# Patient Record
Sex: Male | Born: 1985 | Race: Black or African American | Hispanic: No | Marital: Single | Smoking: Never smoker
Health system: Southern US, Community
[De-identification: ages and names within clinical notes are randomized; demographics above are authoritative.]

## PROBLEM LIST (undated history)

## (undated) DIAGNOSIS — R45851 Suicidal ideations: Secondary | ICD-10-CM

## (undated) DIAGNOSIS — F319 Bipolar disorder, unspecified: Secondary | ICD-10-CM

## (undated) DIAGNOSIS — F259 Schizoaffective disorder, unspecified: Secondary | ICD-10-CM

## (undated) DIAGNOSIS — E7800 Pure hypercholesterolemia, unspecified: Secondary | ICD-10-CM

## (undated) HISTORY — PX: GANGLION CYST EXCISION: SHX1691

## (undated) HISTORY — PX: PILONIDAL CYST EXCISION: SHX744

## (undated) HISTORY — DX: Suicidal ideations: R45.851

---

## 2008-06-28 ENCOUNTER — Emergency Department (HOSPITAL_BASED_OUTPATIENT_CLINIC_OR_DEPARTMENT_OTHER): Admission: EM | Admit: 2008-06-28 | Discharge: 2008-06-29 | Payer: Self-pay | Admitting: Emergency Medicine

## 2008-07-01 ENCOUNTER — Emergency Department (HOSPITAL_BASED_OUTPATIENT_CLINIC_OR_DEPARTMENT_OTHER): Admission: EM | Admit: 2008-07-01 | Discharge: 2008-07-01 | Payer: Self-pay | Admitting: Emergency Medicine

## 2008-09-07 ENCOUNTER — Emergency Department (HOSPITAL_BASED_OUTPATIENT_CLINIC_OR_DEPARTMENT_OTHER): Admission: EM | Admit: 2008-09-07 | Discharge: 2008-09-07 | Payer: Self-pay | Admitting: Emergency Medicine

## 2008-10-11 ENCOUNTER — Other Ambulatory Visit: Payer: Self-pay | Admitting: Emergency Medicine

## 2008-10-12 ENCOUNTER — Inpatient Hospital Stay (HOSPITAL_COMMUNITY): Admission: EM | Admit: 2008-10-12 | Discharge: 2008-10-18 | Payer: Self-pay | Admitting: Internal Medicine

## 2008-10-13 ENCOUNTER — Ambulatory Visit: Payer: Self-pay | Admitting: *Deleted

## 2008-10-25 ENCOUNTER — Other Ambulatory Visit: Payer: Self-pay | Admitting: Emergency Medicine

## 2008-10-26 ENCOUNTER — Inpatient Hospital Stay (HOSPITAL_COMMUNITY): Admission: EM | Admit: 2008-10-26 | Discharge: 2008-10-31 | Payer: Self-pay | Admitting: Internal Medicine

## 2008-12-13 ENCOUNTER — Emergency Department (HOSPITAL_BASED_OUTPATIENT_CLINIC_OR_DEPARTMENT_OTHER): Admission: EM | Admit: 2008-12-13 | Discharge: 2008-12-13 | Payer: Self-pay | Admitting: Emergency Medicine

## 2008-12-24 ENCOUNTER — Emergency Department (HOSPITAL_BASED_OUTPATIENT_CLINIC_OR_DEPARTMENT_OTHER): Admission: EM | Admit: 2008-12-24 | Discharge: 2008-12-24 | Payer: Self-pay | Admitting: Emergency Medicine

## 2008-12-29 ENCOUNTER — Other Ambulatory Visit: Payer: Self-pay | Admitting: Emergency Medicine

## 2008-12-30 ENCOUNTER — Ambulatory Visit: Payer: Self-pay | Admitting: Psychiatry

## 2008-12-30 ENCOUNTER — Other Ambulatory Visit: Payer: Self-pay | Admitting: Emergency Medicine

## 2008-12-30 ENCOUNTER — Inpatient Hospital Stay (HOSPITAL_COMMUNITY): Admission: EM | Admit: 2008-12-30 | Discharge: 2009-01-02 | Payer: Self-pay | Admitting: Psychiatry

## 2009-01-05 ENCOUNTER — Emergency Department (HOSPITAL_BASED_OUTPATIENT_CLINIC_OR_DEPARTMENT_OTHER): Admission: EM | Admit: 2009-01-05 | Discharge: 2009-01-05 | Payer: Self-pay | Admitting: Emergency Medicine

## 2009-01-10 ENCOUNTER — Ambulatory Visit: Payer: Self-pay | Admitting: Diagnostic Radiology

## 2009-01-10 ENCOUNTER — Emergency Department (HOSPITAL_BASED_OUTPATIENT_CLINIC_OR_DEPARTMENT_OTHER): Admission: EM | Admit: 2009-01-10 | Discharge: 2009-01-10 | Payer: Self-pay | Admitting: Emergency Medicine

## 2009-01-14 ENCOUNTER — Emergency Department (HOSPITAL_BASED_OUTPATIENT_CLINIC_OR_DEPARTMENT_OTHER): Admission: EM | Admit: 2009-01-14 | Discharge: 2009-01-15 | Payer: Self-pay | Admitting: Emergency Medicine

## 2009-01-24 ENCOUNTER — Inpatient Hospital Stay (HOSPITAL_COMMUNITY): Admission: EM | Admit: 2009-01-24 | Discharge: 2009-01-26 | Payer: Self-pay | Admitting: Psychiatry

## 2009-01-24 ENCOUNTER — Other Ambulatory Visit: Payer: Self-pay | Admitting: Emergency Medicine

## 2009-01-29 ENCOUNTER — Ambulatory Visit: Payer: Self-pay | Admitting: Psychiatry

## 2009-01-29 ENCOUNTER — Other Ambulatory Visit: Payer: Self-pay | Admitting: Emergency Medicine

## 2009-01-29 ENCOUNTER — Inpatient Hospital Stay (HOSPITAL_COMMUNITY): Admission: RE | Admit: 2009-01-29 | Discharge: 2009-02-04 | Payer: Self-pay | Admitting: Psychiatry

## 2009-02-04 ENCOUNTER — Emergency Department (HOSPITAL_BASED_OUTPATIENT_CLINIC_OR_DEPARTMENT_OTHER): Admission: EM | Admit: 2009-02-04 | Discharge: 2009-02-04 | Payer: Self-pay | Admitting: Emergency Medicine

## 2009-03-10 ENCOUNTER — Emergency Department (HOSPITAL_BASED_OUTPATIENT_CLINIC_OR_DEPARTMENT_OTHER): Admission: EM | Admit: 2009-03-10 | Discharge: 2009-03-10 | Payer: Self-pay | Admitting: Emergency Medicine

## 2009-06-02 ENCOUNTER — Ambulatory Visit: Payer: Self-pay | Admitting: Diagnostic Radiology

## 2009-06-02 ENCOUNTER — Emergency Department (HOSPITAL_BASED_OUTPATIENT_CLINIC_OR_DEPARTMENT_OTHER): Admission: EM | Admit: 2009-06-02 | Discharge: 2009-06-02 | Payer: Self-pay | Admitting: Emergency Medicine

## 2009-06-07 ENCOUNTER — Emergency Department (HOSPITAL_BASED_OUTPATIENT_CLINIC_OR_DEPARTMENT_OTHER): Admission: EM | Admit: 2009-06-07 | Discharge: 2009-06-07 | Payer: Self-pay | Admitting: Emergency Medicine

## 2009-06-28 ENCOUNTER — Emergency Department (HOSPITAL_BASED_OUTPATIENT_CLINIC_OR_DEPARTMENT_OTHER): Admission: EM | Admit: 2009-06-28 | Discharge: 2009-06-29 | Payer: Self-pay | Admitting: Emergency Medicine

## 2009-07-25 ENCOUNTER — Emergency Department (HOSPITAL_BASED_OUTPATIENT_CLINIC_OR_DEPARTMENT_OTHER): Admission: EM | Admit: 2009-07-25 | Discharge: 2009-07-25 | Payer: Self-pay | Admitting: Emergency Medicine

## 2009-09-12 ENCOUNTER — Emergency Department (HOSPITAL_BASED_OUTPATIENT_CLINIC_OR_DEPARTMENT_OTHER): Admission: EM | Admit: 2009-09-12 | Discharge: 2009-09-13 | Payer: Self-pay | Admitting: Emergency Medicine

## 2009-09-13 ENCOUNTER — Ambulatory Visit: Payer: Self-pay | Admitting: Diagnostic Radiology

## 2009-09-23 ENCOUNTER — Emergency Department (HOSPITAL_BASED_OUTPATIENT_CLINIC_OR_DEPARTMENT_OTHER): Admission: EM | Admit: 2009-09-23 | Discharge: 2009-09-23 | Payer: Self-pay | Admitting: Emergency Medicine

## 2009-10-05 ENCOUNTER — Ambulatory Visit: Payer: Self-pay | Admitting: Interventional Radiology

## 2009-10-05 ENCOUNTER — Emergency Department (HOSPITAL_BASED_OUTPATIENT_CLINIC_OR_DEPARTMENT_OTHER): Admission: EM | Admit: 2009-10-05 | Discharge: 2009-10-05 | Payer: Self-pay | Admitting: Emergency Medicine

## 2009-10-14 ENCOUNTER — Emergency Department (HOSPITAL_BASED_OUTPATIENT_CLINIC_OR_DEPARTMENT_OTHER): Admission: EM | Admit: 2009-10-14 | Discharge: 2009-10-15 | Payer: Self-pay | Admitting: Emergency Medicine

## 2009-11-11 ENCOUNTER — Emergency Department (HOSPITAL_BASED_OUTPATIENT_CLINIC_OR_DEPARTMENT_OTHER): Admission: EM | Admit: 2009-11-11 | Discharge: 2009-11-11 | Payer: Self-pay | Admitting: Emergency Medicine

## 2010-01-08 ENCOUNTER — Emergency Department (HOSPITAL_COMMUNITY): Admission: EM | Admit: 2010-01-08 | Discharge: 2010-01-10 | Payer: Self-pay | Admitting: Emergency Medicine

## 2010-01-19 IMAGING — CT CT PELVIS W/O CM
2 of 4 series · 16 of 46 positions shown, 18 images · non-contrast
Comparison: None

CT ABDOMEN

CLINICAL DATA: Abdominal pain left flank pain

CT ABDOMEN AND PELVIS WITHOUT CONTRAST
TECHNIQUE: Multidetector CT imaging of the abdomen and pelvis was
performed following the standard
protocol without intravenous contrast.

[Series 2: renal stone > 200 lbs 5.0 b31f · axial · 0.87mm/px · z∈[-604,-164]mm · 13 of 97 slices shown, 15 images]
[im 5/97  soft-tissue]
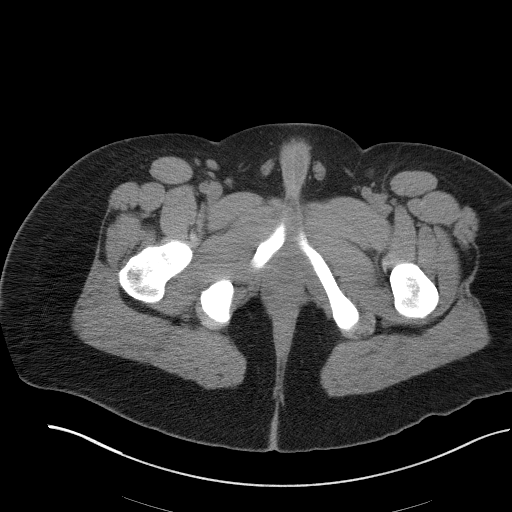
[im 5/97  bone]
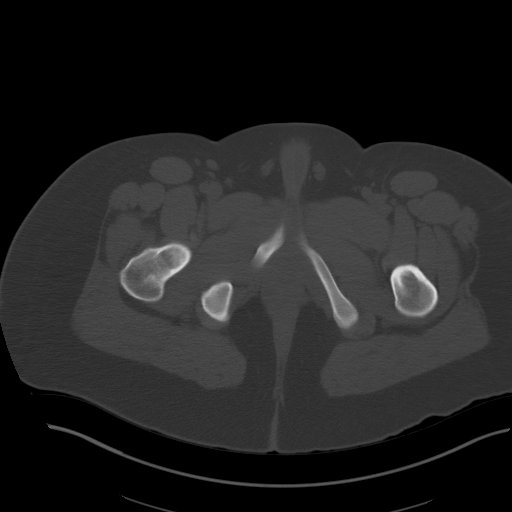
[im 13/97  soft-tissue]
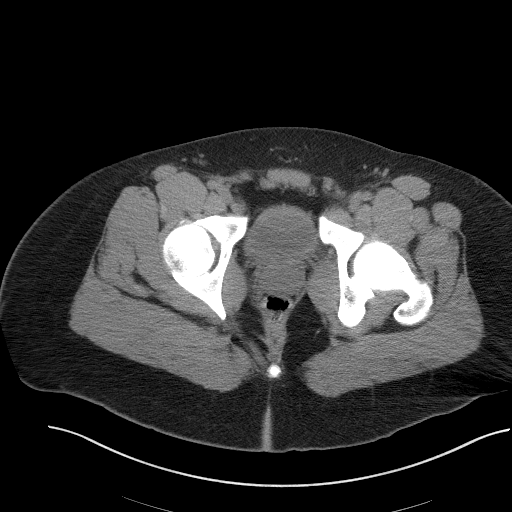
[im 21/97  soft-tissue]
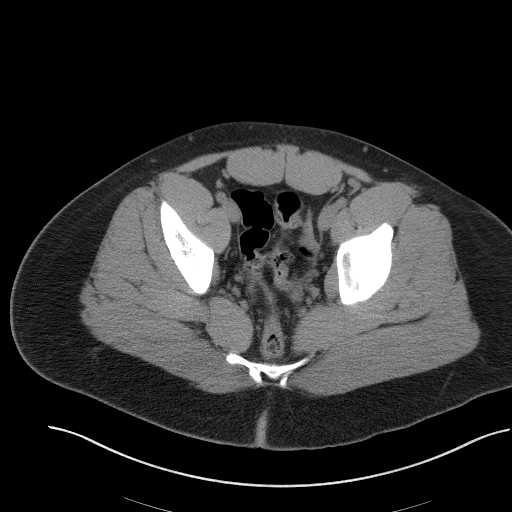
[im 29/97  soft-tissue]
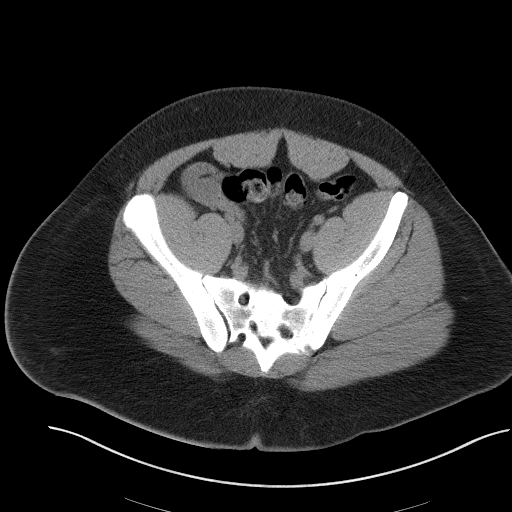
[im 33/97  soft-tissue]
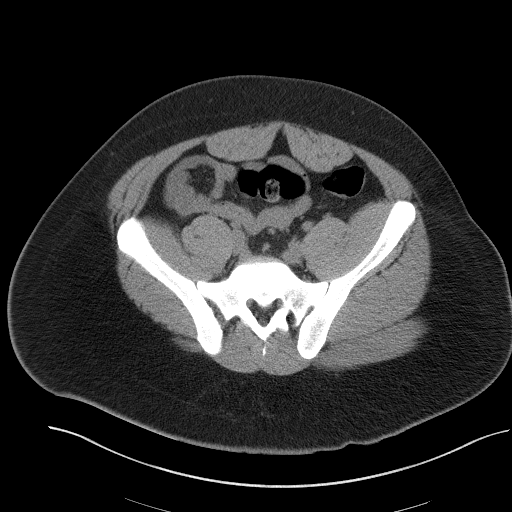
[im 41/97  soft-tissue]
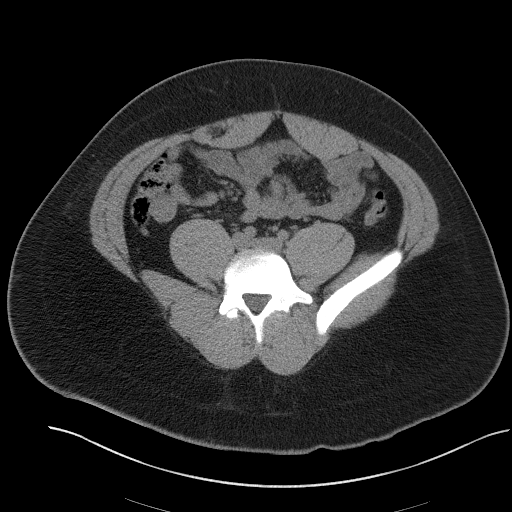
[im 49/97  soft-tissue]
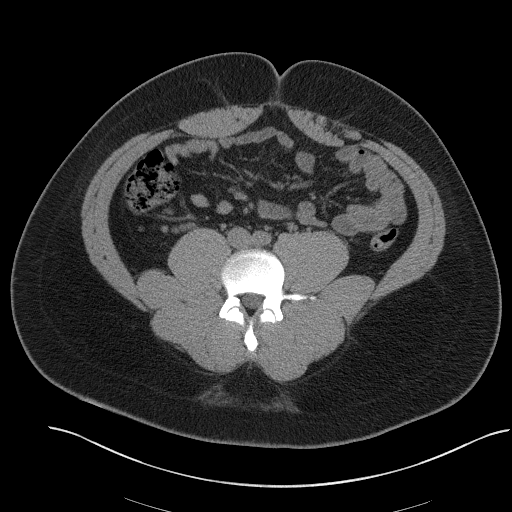
[im 57/97  soft-tissue]
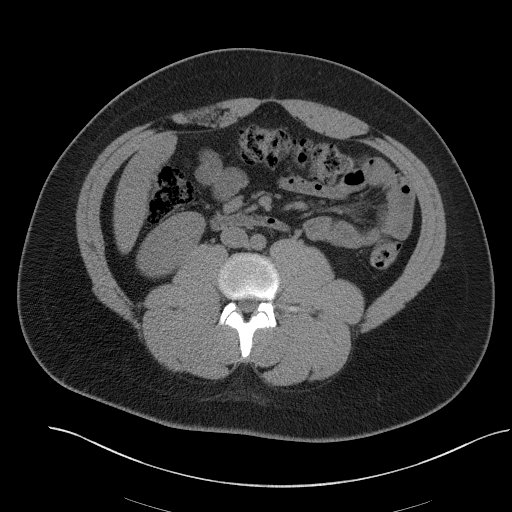
[im 65/97  soft-tissue]
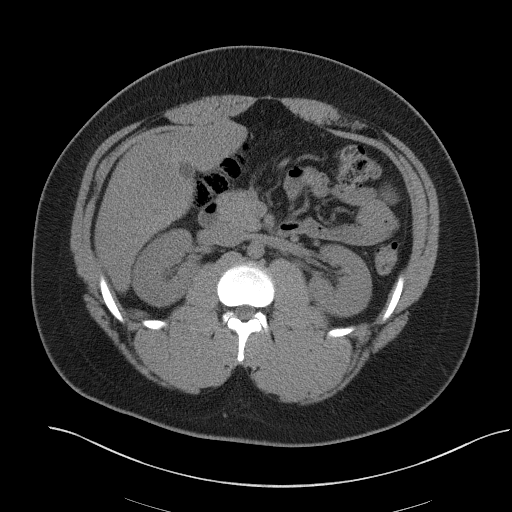
[im 65/97  bone]
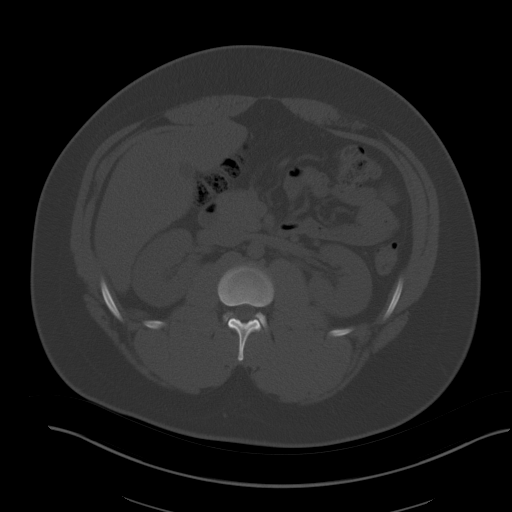
[im 69/97  soft-tissue]
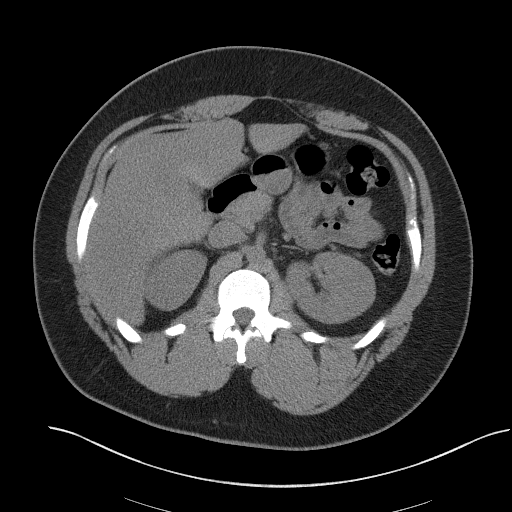
[im 77/97  soft-tissue]
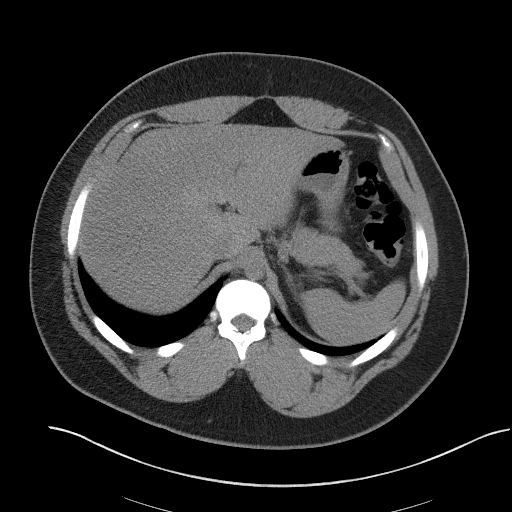
[im 85/97  soft-tissue]
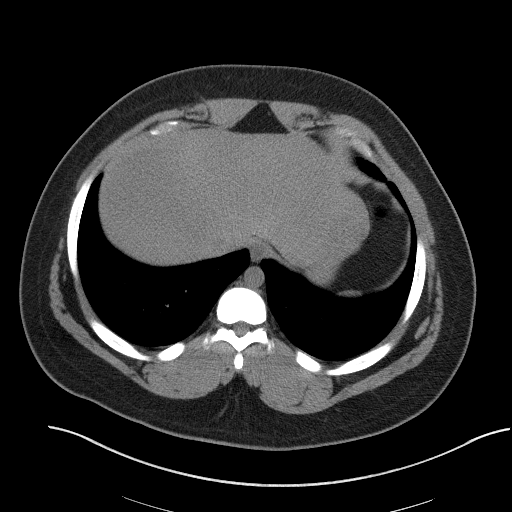
[im 93/97  soft-tissue]
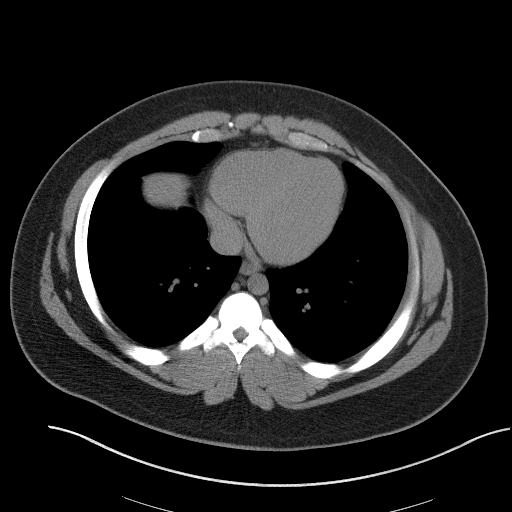

[Series 3: renal stone 2.0 coronal · coronal · 0.80mm/px · 3 of 149 slices shown]
[im 50/149  soft-tissue]
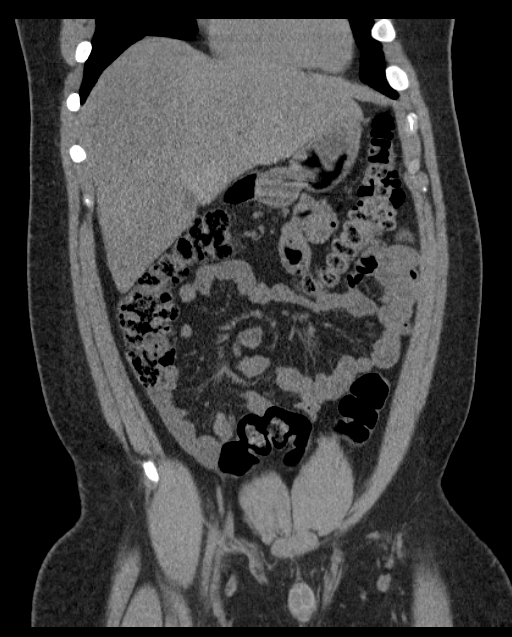
[im 66/149  soft-tissue]
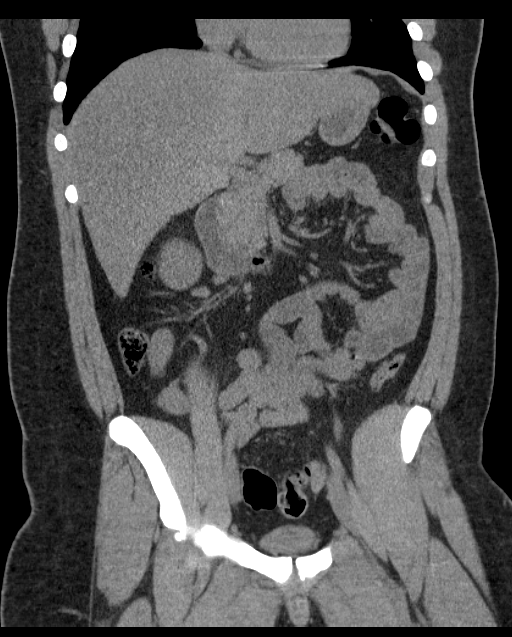
[im 83/149  soft-tissue]
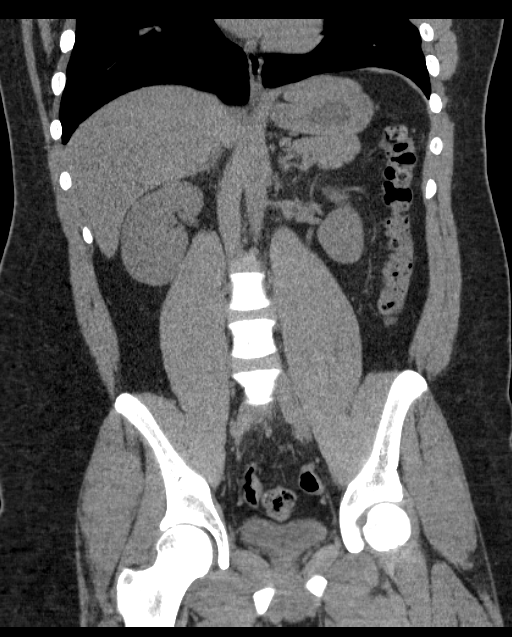

[16 of 46 positions shown; findings below may reference images not displayed]

FINDINGS: Visualized lung bases are clear. The kidneys are normal
in size and parenchymal thickness.  There are no renal calculi.
There is no hydronephrosis or perinephric fluid collection.

The ureters are normal in caliber.  No ureteral stone is seen.

The unfused appearance of the liver, gallbladder, adrenal glands,
spleen, and pancreas is within normal limits.  Stomach, small bowel
loops, and colon are unremarkable.

Lymph nodes in the right lower quadrant are prominent in number,
and there is one that is slightly prominent size measuring 1.6 x
1.2 cm.  There is no ascites or free air.  No acute osseous
abnormality.
IMPRESSION: 1.Negative for urinary tract stone or obstruction.
2.  Prominent number of right lower quadrant lymph nodes.  This
finding is nonspecific but may be  can be seen in the setting of
mesenteric adenitis.

CT PELVIS
FINDINGS: The appendix is identified and is normal in caliber.  No
evidence of appendicitis.  There is no free fluid in the pelvis.
The distal ureters are normal in caliber.  No distal ureteral or
bladder stone is seen.  No stone is seen within the urethra.
IMPRESSION: No acute findings in the pelvis.

## 2010-01-23 ENCOUNTER — Emergency Department (HOSPITAL_BASED_OUTPATIENT_CLINIC_OR_DEPARTMENT_OTHER): Admission: EM | Admit: 2010-01-23 | Discharge: 2010-01-23 | Payer: Self-pay | Admitting: Emergency Medicine

## 2010-02-01 ENCOUNTER — Emergency Department (HOSPITAL_BASED_OUTPATIENT_CLINIC_OR_DEPARTMENT_OTHER): Admission: EM | Admit: 2010-02-01 | Discharge: 2010-02-01 | Payer: Self-pay | Admitting: Emergency Medicine

## 2010-02-03 ENCOUNTER — Emergency Department (HOSPITAL_BASED_OUTPATIENT_CLINIC_OR_DEPARTMENT_OTHER): Admission: EM | Admit: 2010-02-03 | Discharge: 2010-02-03 | Payer: Self-pay | Admitting: Emergency Medicine

## 2010-02-28 ENCOUNTER — Emergency Department (HOSPITAL_BASED_OUTPATIENT_CLINIC_OR_DEPARTMENT_OTHER): Admission: EM | Admit: 2010-02-28 | Discharge: 2010-02-28 | Payer: Self-pay | Admitting: Emergency Medicine

## 2010-03-12 ENCOUNTER — Emergency Department (HOSPITAL_BASED_OUTPATIENT_CLINIC_OR_DEPARTMENT_OTHER): Admission: EM | Admit: 2010-03-12 | Discharge: 2010-03-12 | Payer: Self-pay | Admitting: Emergency Medicine

## 2010-03-15 ENCOUNTER — Emergency Department (HOSPITAL_BASED_OUTPATIENT_CLINIC_OR_DEPARTMENT_OTHER): Admission: EM | Admit: 2010-03-15 | Discharge: 2010-03-15 | Payer: Self-pay | Admitting: Emergency Medicine

## 2010-03-15 ENCOUNTER — Ambulatory Visit: Payer: Self-pay | Admitting: Diagnostic Radiology

## 2010-03-20 ENCOUNTER — Ambulatory Visit: Payer: Self-pay | Admitting: Diagnostic Radiology

## 2010-03-20 ENCOUNTER — Emergency Department (HOSPITAL_BASED_OUTPATIENT_CLINIC_OR_DEPARTMENT_OTHER): Admission: EM | Admit: 2010-03-20 | Discharge: 2010-03-20 | Payer: Self-pay | Admitting: Emergency Medicine

## 2010-04-21 ENCOUNTER — Emergency Department (HOSPITAL_BASED_OUTPATIENT_CLINIC_OR_DEPARTMENT_OTHER): Admission: EM | Admit: 2010-04-21 | Discharge: 2010-04-21 | Payer: Self-pay | Admitting: Emergency Medicine

## 2010-04-21 ENCOUNTER — Ambulatory Visit: Payer: Self-pay | Admitting: Radiology

## 2010-05-26 ENCOUNTER — Emergency Department (HOSPITAL_BASED_OUTPATIENT_CLINIC_OR_DEPARTMENT_OTHER): Admission: EM | Admit: 2010-05-26 | Discharge: 2010-05-27 | Payer: Self-pay | Admitting: Emergency Medicine

## 2010-05-27 ENCOUNTER — Ambulatory Visit: Payer: Self-pay | Admitting: Diagnostic Radiology

## 2010-06-10 ENCOUNTER — Emergency Department (HOSPITAL_BASED_OUTPATIENT_CLINIC_OR_DEPARTMENT_OTHER): Admission: EM | Admit: 2010-06-10 | Discharge: 2010-06-11 | Payer: Self-pay | Admitting: Emergency Medicine

## 2010-07-28 ENCOUNTER — Emergency Department (HOSPITAL_BASED_OUTPATIENT_CLINIC_OR_DEPARTMENT_OTHER): Admission: EM | Admit: 2010-07-28 | Discharge: 2010-07-29 | Payer: Self-pay | Admitting: Emergency Medicine

## 2010-07-29 ENCOUNTER — Ambulatory Visit: Payer: Self-pay | Admitting: Diagnostic Radiology

## 2010-08-24 ENCOUNTER — Emergency Department (HOSPITAL_BASED_OUTPATIENT_CLINIC_OR_DEPARTMENT_OTHER): Admission: EM | Admit: 2010-08-24 | Discharge: 2010-08-24 | Payer: Self-pay | Admitting: Emergency Medicine

## 2010-09-20 ENCOUNTER — Emergency Department (HOSPITAL_BASED_OUTPATIENT_CLINIC_OR_DEPARTMENT_OTHER): Admission: EM | Admit: 2010-09-20 | Discharge: 2010-09-21 | Payer: Self-pay | Admitting: Emergency Medicine

## 2010-10-03 ENCOUNTER — Ambulatory Visit: Payer: Self-pay | Admitting: Diagnostic Radiology

## 2010-10-03 ENCOUNTER — Emergency Department (HOSPITAL_BASED_OUTPATIENT_CLINIC_OR_DEPARTMENT_OTHER): Admission: EM | Admit: 2010-10-03 | Discharge: 2010-10-03 | Payer: Self-pay | Admitting: Emergency Medicine

## 2010-10-20 ENCOUNTER — Emergency Department (HOSPITAL_BASED_OUTPATIENT_CLINIC_OR_DEPARTMENT_OTHER): Admission: EM | Admit: 2010-10-20 | Discharge: 2010-10-21 | Payer: Self-pay | Admitting: Emergency Medicine

## 2010-10-31 ENCOUNTER — Emergency Department (HOSPITAL_BASED_OUTPATIENT_CLINIC_OR_DEPARTMENT_OTHER): Admission: EM | Admit: 2010-10-31 | Discharge: 2010-10-31 | Payer: Self-pay | Admitting: Emergency Medicine

## 2010-11-24 ENCOUNTER — Emergency Department (HOSPITAL_BASED_OUTPATIENT_CLINIC_OR_DEPARTMENT_OTHER)
Admission: EM | Admit: 2010-11-24 | Discharge: 2010-11-24 | Payer: Self-pay | Source: Home / Self Care | Admitting: Emergency Medicine

## 2011-01-06 ENCOUNTER — Emergency Department (HOSPITAL_BASED_OUTPATIENT_CLINIC_OR_DEPARTMENT_OTHER)
Admission: EM | Admit: 2011-01-06 | Discharge: 2011-01-06 | Disposition: A | Discharge status: Home / Self Care | Payer: Medicaid Other | Attending: Emergency Medicine | Admitting: Emergency Medicine

## 2011-01-06 DIAGNOSIS — E119 Type 2 diabetes mellitus without complications: Secondary | ICD-10-CM | POA: Insufficient documentation

## 2011-01-06 DIAGNOSIS — H9209 Otalgia, unspecified ear: Secondary | ICD-10-CM | POA: Insufficient documentation

## 2011-01-06 DIAGNOSIS — R51 Headache: Secondary | ICD-10-CM | POA: Insufficient documentation

## 2011-01-06 DIAGNOSIS — I10 Essential (primary) hypertension: Secondary | ICD-10-CM | POA: Insufficient documentation

## 2011-01-06 DIAGNOSIS — R110 Nausea: Secondary | ICD-10-CM | POA: Insufficient documentation

## 2011-01-06 DIAGNOSIS — F259 Schizoaffective disorder, unspecified: Secondary | ICD-10-CM | POA: Insufficient documentation

## 2011-01-06 DIAGNOSIS — E7089 Other disorders of aromatic amino-acid metabolism: Secondary | ICD-10-CM | POA: Insufficient documentation

## 2011-01-06 DIAGNOSIS — E669 Obesity, unspecified: Secondary | ICD-10-CM | POA: Insufficient documentation

## 2011-01-06 DIAGNOSIS — F79 Unspecified intellectual disabilities: Secondary | ICD-10-CM | POA: Insufficient documentation

## 2011-01-06 DIAGNOSIS — F319 Bipolar disorder, unspecified: Secondary | ICD-10-CM | POA: Insufficient documentation

## 2011-02-10 LAB — URINALYSIS, ROUTINE W REFLEX MICROSCOPIC
Glucose, UA: NEGATIVE mg/dL
Nitrite: NEGATIVE
Protein, ur: NEGATIVE mg/dL
Protein, ur: NEGATIVE mg/dL
Urobilinogen, UA: 1 mg/dL (ref 0.0–1.0)

## 2011-02-10 LAB — CBC
HCT: 38.1 % — ABNORMAL LOW (ref 39.0–52.0)
MCH: 28.3 pg (ref 26.0–34.0)
MCV: 83.9 fL (ref 78.0–100.0)
Platelets: 356 10*3/uL (ref 150–400)
RBC: 4.75 MIL/uL (ref 4.22–5.81)
RDW: 12.5 % (ref 11.5–15.5)
RDW: 12.8 % (ref 11.5–15.5)
WBC: 11.8 10*3/uL — ABNORMAL HIGH (ref 4.0–10.5)
WBC: 13 10*3/uL — ABNORMAL HIGH (ref 4.0–10.5)

## 2011-02-10 LAB — LIPASE, BLOOD: Lipase: 57 U/L (ref 23–300)

## 2011-02-10 LAB — COMPREHENSIVE METABOLIC PANEL
Alkaline Phosphatase: 98 U/L (ref 39–117)
BUN: 14 mg/dL (ref 6–23)
Glucose, Bld: 124 mg/dL — ABNORMAL HIGH (ref 70–99)
Potassium: 3.9 mEq/L (ref 3.5–5.1)
Total Bilirubin: 1.7 mg/dL — ABNORMAL HIGH (ref 0.3–1.2)
Total Protein: 7.8 g/dL (ref 6.0–8.3)

## 2011-02-10 LAB — DIFFERENTIAL
Basophils Absolute: 0 10*3/uL (ref 0.0–0.1)
Basophils Absolute: 0.1 10*3/uL (ref 0.0–0.1)
Basophils Relative: 0 % (ref 0–1)
Eosinophils Absolute: 0.1 10*3/uL (ref 0.0–0.7)
Lymphocytes Relative: 25 % (ref 12–46)
Lymphs Abs: 3.3 10*3/uL (ref 0.7–4.0)
Monocytes Relative: 3 % (ref 3–12)
Neutro Abs: 10.5 10*3/uL — ABNORMAL HIGH (ref 1.7–7.7)
Neutrophils Relative %: 65 % (ref 43–77)
Neutrophils Relative %: 90 % — ABNORMAL HIGH (ref 43–77)

## 2011-02-10 LAB — BASIC METABOLIC PANEL
Calcium: 9.9 mg/dL (ref 8.4–10.5)
Creatinine, Ser: 1.1 mg/dL (ref 0.4–1.5)
GFR calc Af Amer: >60 mL/min (ref >60)

## 2011-02-11 LAB — URINALYSIS, ROUTINE W REFLEX MICROSCOPIC
Glucose, UA: NEGATIVE mg/dL
Hgb urine dipstick: NEGATIVE
Ketones, ur: 15 mg/dL — AB
Protein, ur: NEGATIVE mg/dL
Urobilinogen, UA: 1 mg/dL (ref 0.0–1.0)

## 2011-02-11 LAB — GC/CHLAMYDIA PROBE AMP, GENITAL: GC Probe Amp, Genital: NEGATIVE

## 2011-02-13 LAB — VALPROIC ACID LEVEL: Valproic Acid Lvl: 75.5 ug/mL (ref 50.0–100.0)

## 2011-02-13 LAB — LIPASE, BLOOD: Lipase: 61 U/L (ref 23–300)

## 2011-02-13 LAB — URINALYSIS, ROUTINE W REFLEX MICROSCOPIC
Bilirubin Urine: NEGATIVE
Glucose, UA: NEGATIVE mg/dL
Hgb urine dipstick: NEGATIVE
Ketones, ur: NEGATIVE mg/dL
Protein, ur: NEGATIVE mg/dL

## 2011-02-13 LAB — DIFFERENTIAL
Basophils Relative: 4 % — ABNORMAL HIGH (ref 0–1)
Lymphs Abs: 3.1 10*3/uL (ref 0.7–4.0)
Monocytes Relative: 9 % (ref 3–12)
Neutro Abs: 5.7 10*3/uL (ref 1.7–7.7)
Neutrophils Relative %: 55 % (ref 43–77)

## 2011-02-13 LAB — COMPREHENSIVE METABOLIC PANEL
Alkaline Phosphatase: 68 U/L (ref 39–117)
BUN: 12 mg/dL (ref 6–23)
CO2: 24 mEq/L (ref 19–32)
Calcium: 9.2 mg/dL (ref 8.4–10.5)
GFR calc non Af Amer: >60 mL/min (ref >60)
Glucose, Bld: 91 mg/dL (ref 70–99)
Total Protein: 7.5 g/dL (ref 6.0–8.3)

## 2011-02-13 LAB — CBC
HCT: 37.2 % — ABNORMAL LOW (ref 39.0–52.0)
MCHC: 32.8 g/dL (ref 30.0–36.0)
MCV: 85 fL (ref 78.0–100.0)
RDW: 13.6 % (ref 11.5–15.5)

## 2011-02-15 LAB — DIFFERENTIAL
Basophils Absolute: 0.1 10*3/uL (ref 0.0–0.1)
Lymphocytes Relative: 30 % (ref 12–46)
Lymphs Abs: 3 10*3/uL (ref 0.7–4.0)
Neutro Abs: 5.7 10*3/uL (ref 1.7–7.7)
Neutrophils Relative %: 57 % (ref 43–77)

## 2011-02-15 LAB — BASIC METABOLIC PANEL
BUN: 8 mg/dL (ref 6–23)
Chloride: 106 mEq/L (ref 96–112)
Creatinine, Ser: 0.9 mg/dL (ref 0.4–1.5)
GFR calc Af Amer: >60 mL/min (ref >60)
GFR calc non Af Amer: >60 mL/min (ref >60)

## 2011-02-15 LAB — URINALYSIS, ROUTINE W REFLEX MICROSCOPIC
Bilirubin Urine: NEGATIVE
Nitrite: NEGATIVE
Specific Gravity, Urine: 1.028 (ref 1.005–1.030)
Urobilinogen, UA: 1 mg/dL (ref 0.0–1.0)
pH: 6.5 (ref 5.0–8.0)

## 2011-02-15 LAB — CBC
MCV: 85.5 fL (ref 78.0–100.0)
Platelets: 305 10*3/uL (ref 150–400)
RBC: 4.48 MIL/uL (ref 4.22–5.81)
RDW: 13.3 % (ref 11.5–15.5)
WBC: 9.9 10*3/uL (ref 4.0–10.5)

## 2011-02-15 LAB — HEMOCCULT GUIAC POC 1CARD (OFFICE): Fecal Occult Bld: NEGATIVE

## 2011-02-17 LAB — URINALYSIS, ROUTINE W REFLEX MICROSCOPIC
Hgb urine dipstick: NEGATIVE
Nitrite: NEGATIVE
Protein, ur: NEGATIVE mg/dL
Specific Gravity, Urine: 1.02 (ref 1.005–1.030)
Urobilinogen, UA: 1 mg/dL (ref 0.0–1.0)

## 2011-02-18 LAB — DIFFERENTIAL
Basophils Relative: 0 % (ref 0–1)
Eosinophils Absolute: 0.1 10*3/uL (ref 0.0–0.7)
Eosinophils Relative: 1 % (ref 0–5)
Monocytes Absolute: 0.8 10*3/uL (ref 0.1–1.0)
Monocytes Relative: 9 % (ref 3–12)
Neutro Abs: 5.1 10*3/uL (ref 1.7–7.7)

## 2011-02-18 LAB — BASIC METABOLIC PANEL
BUN: 16 mg/dL (ref 6–23)
CO2: 27 mEq/L (ref 19–32)
CO2: 25 mEq/L (ref 19–32)
Calcium: 8.9 mg/dL (ref 8.4–10.5)
Chloride: 107 mEq/L (ref 96–112)
Chloride: 103 mEq/L (ref 96–112)
Creatinine, Ser: 1 mg/dL (ref 0.4–1.5)
GFR calc Af Amer: >60 mL/min (ref >60)
GFR calc Af Amer: >60 mL/min (ref >60)
Glucose, Bld: 128 mg/dL — ABNORMAL HIGH (ref 70–99)
Sodium: 137 mEq/L (ref 135–145)

## 2011-02-18 LAB — HEMOCCULT GUIAC POC 1CARD (OFFICE): Fecal Occult Bld: POSITIVE

## 2011-02-18 LAB — RAPID URINE DRUG SCREEN, HOSP PERFORMED
Barbiturates: NOT DETECTED
Opiates: NOT DETECTED

## 2011-02-18 LAB — CBC
HCT: 39.2 % (ref 39.0–52.0)
Hemoglobin: 13.1 g/dL (ref 13.0–17.0)
MCHC: 33.2 g/dL (ref 30.0–36.0)
MCHC: 33.4 g/dL (ref 30.0–36.0)
MCV: 85.2 fL (ref 78.0–100.0)
MCV: 85.6 fL (ref 78.0–100.0)
Platelets: 260 10*3/uL (ref 150–400)
RBC: 4.58 MIL/uL (ref 4.22–5.81)
RDW: 13.9 % (ref 11.5–15.5)
WBC: 9.5 10*3/uL (ref 4.0–10.5)

## 2011-02-18 LAB — GLUCOSE, CAPILLARY: Glucose-Capillary: 102 mg/dL — ABNORMAL HIGH (ref 70–99)

## 2011-02-22 LAB — CBC
HCT: 37.6 % — ABNORMAL LOW (ref 39.0–52.0)
Hemoglobin: 12.4 g/dL — ABNORMAL LOW (ref 13.0–17.0)
MCV: 85.2 fL (ref 78.0–100.0)
Platelets: 221 10*3/uL (ref 150–400)
RBC: 4.41 MIL/uL (ref 4.22–5.81)
WBC: 9.8 10*3/uL (ref 4.0–10.5)

## 2011-02-22 LAB — URINALYSIS, ROUTINE W REFLEX MICROSCOPIC
Glucose, UA: 500 mg/dL — AB
Hgb urine dipstick: NEGATIVE
Protein, ur: NEGATIVE mg/dL

## 2011-02-22 LAB — COMPREHENSIVE METABOLIC PANEL
BUN: 10 mg/dL (ref 6–23)
CO2: 29 mEq/L (ref 19–32)
Chloride: 101 mEq/L (ref 96–112)
Creatinine, Ser: 0.8 mg/dL (ref 0.4–1.5)
GFR calc non Af Amer: >60 mL/min (ref >60)
Glucose, Bld: 203 mg/dL — ABNORMAL HIGH (ref 70–99)
Total Bilirubin: 1.2 mg/dL (ref 0.3–1.2)

## 2011-02-22 LAB — GLUCOSE, CAPILLARY: Glucose-Capillary: 243 mg/dL — ABNORMAL HIGH (ref 70–99)

## 2011-02-22 LAB — DIFFERENTIAL
Basophils Absolute: 0.3 10*3/uL — ABNORMAL HIGH (ref 0.0–0.1)
Lymphocytes Relative: 30 % (ref 12–46)
Neutro Abs: 5.4 10*3/uL (ref 1.7–7.7)

## 2011-02-22 LAB — LIPASE, BLOOD: Lipase: 114 U/L (ref 23–300)

## 2011-03-04 LAB — DIFFERENTIAL
Basophils Absolute: 0.2 10*3/uL — ABNORMAL HIGH (ref 0.0–0.1)
Lymphocytes Relative: 35 % (ref 12–46)
Monocytes Absolute: 0.8 10*3/uL (ref 0.1–1.0)
Neutro Abs: 5 10*3/uL (ref 1.7–7.7)
Neutrophils Relative %: 54 % (ref 43–77)

## 2011-03-04 LAB — BASIC METABOLIC PANEL
Calcium: 10 mg/dL (ref 8.4–10.5)
Creatinine, Ser: 0.9 mg/dL (ref 0.4–1.5)
GFR calc Af Amer: >60 mL/min (ref >60)
GFR calc non Af Amer: >60 mL/min (ref >60)
Glucose, Bld: 89 mg/dL (ref 70–99)
Sodium: 144 mEq/L (ref 135–145)

## 2011-03-04 LAB — CBC
Hemoglobin: 13.4 g/dL (ref 13.0–17.0)
RBC: 4.84 MIL/uL (ref 4.22–5.81)
RDW: 13.8 % (ref 11.5–15.5)

## 2011-03-05 LAB — URINALYSIS, ROUTINE W REFLEX MICROSCOPIC
Bilirubin Urine: NEGATIVE
Glucose, UA: >1000 mg/dL — AB
Ketones, ur: NEGATIVE mg/dL
Leukocytes, UA: NEGATIVE
Leukocytes, UA: NEGATIVE
Nitrite: NEGATIVE
Nitrite: NEGATIVE
Protein, ur: NEGATIVE mg/dL
Specific Gravity, Urine: 1.025 (ref 1.005–1.030)
Urobilinogen, UA: 0.2 mg/dL (ref 0.0–1.0)
pH: 7 (ref 5.0–8.0)
pH: 5.5 (ref 5.0–8.0)

## 2011-03-05 LAB — DIFFERENTIAL
Basophils Absolute: 0 10*3/uL (ref 0.0–0.1)
Basophils Relative: 1 % (ref 0–1)
Eosinophils Absolute: 0.1 10*3/uL (ref 0.0–0.7)
Eosinophils Absolute: 0.1 10*3/uL (ref 0.0–0.7)
Eosinophils Relative: 1 % (ref 0–5)
Lymphocytes Relative: 33 % (ref 12–46)
Lymphs Abs: 2.9 10*3/uL (ref 0.7–4.0)
Monocytes Relative: 12 % (ref 3–12)
Neutro Abs: 4.9 10*3/uL (ref 1.7–7.7)
Neutrophils Relative %: 54 % (ref 43–77)
Neutrophils Relative %: 52 % (ref 43–77)

## 2011-03-05 LAB — GLUCOSE, CAPILLARY
Glucose-Capillary: 285 mg/dL — ABNORMAL HIGH (ref 70–99)
Glucose-Capillary: 271 mg/dL — ABNORMAL HIGH (ref 70–99)
Glucose-Capillary: 306 mg/dL — ABNORMAL HIGH (ref 70–99)

## 2011-03-05 LAB — BASIC METABOLIC PANEL
BUN: 13 mg/dL (ref 6–23)
BUN: 9 mg/dL (ref 6–23)
Calcium: 10 mg/dL (ref 8.4–10.5)
Chloride: 100 mEq/L (ref 96–112)
Chloride: 98 mEq/L (ref 96–112)
Creatinine, Ser: 0.9 mg/dL (ref 0.4–1.5)
Creatinine, Ser: 0.9 mg/dL (ref 0.4–1.5)
GFR calc Af Amer: >60 mL/min (ref >60)
GFR calc non Af Amer: >60 mL/min (ref >60)
Glucose, Bld: 318 mg/dL — ABNORMAL HIGH (ref 70–99)
Potassium: 4.1 mEq/L (ref 3.5–5.1)

## 2011-03-05 LAB — URINE MICROSCOPIC-ADD ON

## 2011-03-05 LAB — CBC
HCT: 36.8 % — ABNORMAL LOW (ref 39.0–52.0)
MCV: 85.5 fL (ref 78.0–100.0)
Platelets: 238 10*3/uL (ref 150–400)
Platelets: 216 10*3/uL (ref 150–400)
RBC: 4.4 MIL/uL (ref 4.22–5.81)
RDW: 13.1 % (ref 11.5–15.5)
WBC: 9 10*3/uL (ref 4.0–10.5)

## 2011-03-08 LAB — CBC
MCHC: 33.1 g/dL (ref 30.0–36.0)
MCV: 86.4 fL (ref 78.0–100.0)
Platelets: 268 10*3/uL (ref 150–400)
RDW: 13.4 % (ref 11.5–15.5)

## 2011-03-08 LAB — DIFFERENTIAL
Basophils Absolute: 0 10*3/uL (ref 0.0–0.1)
Basophils Relative: 0 % (ref 0–1)
Eosinophils Relative: 1 % (ref 0–5)
Monocytes Absolute: 1.2 10*3/uL — ABNORMAL HIGH (ref 0.1–1.0)
Monocytes Relative: 13 % — ABNORMAL HIGH (ref 3–12)

## 2011-03-08 LAB — URINALYSIS, ROUTINE W REFLEX MICROSCOPIC
Bilirubin Urine: NEGATIVE
Glucose, UA: NEGATIVE mg/dL
Hgb urine dipstick: NEGATIVE
Hgb urine dipstick: NEGATIVE
Ketones, ur: 15 mg/dL — AB
Leukocytes, UA: NEGATIVE
Protein, ur: NEGATIVE mg/dL
Protein, ur: NEGATIVE mg/dL
Specific Gravity, Urine: 1.038 — ABNORMAL HIGH (ref 1.005–1.030)
pH: 6 (ref 5.0–8.0)

## 2011-03-08 LAB — COMPREHENSIVE METABOLIC PANEL
AST: 45 U/L — ABNORMAL HIGH (ref 0–37)
Albumin: 4 g/dL (ref 3.5–5.2)
CO2: 30 mEq/L (ref 19–32)
Calcium: 9.6 mg/dL (ref 8.4–10.5)
Creatinine, Ser: 0.9 mg/dL (ref 0.4–1.5)
GFR calc Af Amer: >60 mL/min (ref >60)
GFR calc non Af Amer: >60 mL/min (ref >60)
Total Protein: 7.2 g/dL (ref 6.0–8.3)

## 2011-03-08 LAB — URINE MICROSCOPIC-ADD ON

## 2011-03-08 LAB — URINE CULTURE
Colony Count: NO GROWTH
Culture: NO GROWTH

## 2011-03-12 LAB — DIFFERENTIAL
Basophils Absolute: 0.1 10*3/uL (ref 0.0–0.1)
Lymphocytes Relative: 22 % (ref 12–46)
Neutro Abs: 5.8 10*3/uL (ref 1.7–7.7)

## 2011-03-12 LAB — ACETAMINOPHEN LEVEL: Acetaminophen (Tylenol), Serum: <10 ug/mL — ABNORMAL LOW (ref 10–30)

## 2011-03-12 LAB — CBC
Hemoglobin: 12.5 g/dL — ABNORMAL LOW (ref 13.0–17.0)
Platelets: 251 10*3/uL (ref 150–400)
RDW: 14.9 % (ref 11.5–15.5)

## 2011-03-12 LAB — VALPROIC ACID LEVEL
Valproic Acid Lvl: 62.9 ug/mL (ref 50.0–100.0)
Valproic Acid Lvl: 68 ug/mL (ref 50.0–100.0)

## 2011-03-12 LAB — ETHANOL: Alcohol, Ethyl (B): <5 mg/dL (ref 0–10)

## 2011-03-12 LAB — POCT I-STAT, CHEM 8
BUN: 12 mg/dL (ref 6–23)
Chloride: 108 mEq/L (ref 96–112)
Sodium: 137 mEq/L (ref 135–145)

## 2011-03-16 LAB — COMPREHENSIVE METABOLIC PANEL
AST: 30 U/L (ref 0–37)
Albumin: 4.1 g/dL (ref 3.5–5.2)
Albumin: 4.4 g/dL (ref 3.5–5.2)
BUN: 12 mg/dL (ref 6–23)
Chloride: 101 mEq/L (ref 96–112)
Chloride: 103 mEq/L (ref 96–112)
Creatinine, Ser: 0.9 mg/dL (ref 0.4–1.5)
Creatinine, Ser: 1 mg/dL (ref 0.4–1.5)
GFR calc Af Amer: >60 mL/min (ref >60)
Potassium: 4.1 mEq/L (ref 3.5–5.1)
Total Bilirubin: 0.5 mg/dL (ref 0.3–1.2)
Total Bilirubin: 0.6 mg/dL (ref 0.3–1.2)
Total Protein: 7.5 g/dL (ref 6.0–8.3)

## 2011-03-16 LAB — POCT TOXICOLOGY PANEL

## 2011-03-16 LAB — BASIC METABOLIC PANEL
Calcium: 9.5 mg/dL (ref 8.4–10.5)
GFR calc Af Amer: >60 mL/min (ref >60)
GFR calc non Af Amer: >60 mL/min (ref >60)
Potassium: 4 mEq/L (ref 3.5–5.1)
Sodium: 140 mEq/L (ref 135–145)

## 2011-03-16 LAB — CBC
HCT: 40.5 % (ref 39.0–52.0)
Hemoglobin: 13.8 g/dL (ref 13.0–17.0)
MCV: 84.7 fL (ref 78.0–100.0)
Platelets: 269 10*3/uL (ref 150–400)
Platelets: 286 10*3/uL (ref 150–400)
Platelets: 334 10*3/uL (ref 150–400)
RDW: 14 % (ref 11.5–15.5)
RDW: 14.1 % (ref 11.5–15.5)
WBC: 9.2 10*3/uL (ref 4.0–10.5)
WBC: 10 10*3/uL (ref 4.0–10.5)
WBC: 9.9 10*3/uL (ref 4.0–10.5)

## 2011-03-16 LAB — URINALYSIS, ROUTINE W REFLEX MICROSCOPIC
Bilirubin Urine: NEGATIVE
Glucose, UA: NEGATIVE mg/dL
Glucose, UA: NEGATIVE mg/dL
Ketones, ur: 15 mg/dL — AB
Ketones, ur: NEGATIVE mg/dL
Nitrite: NEGATIVE
Protein, ur: NEGATIVE mg/dL
Specific Gravity, Urine: 1.024 (ref 1.005–1.030)
pH: 5.5 (ref 5.0–8.0)
pH: 6.5 (ref 5.0–8.0)

## 2011-03-16 LAB — DIFFERENTIAL
Basophils Absolute: 0.2 10*3/uL — ABNORMAL HIGH (ref 0.0–0.1)
Basophils Absolute: 0 10*3/uL (ref 0.0–0.1)
Eosinophils Relative: 1 % (ref 0–5)
Eosinophils Relative: 0 % (ref 0–5)
Lymphocytes Relative: 29 % (ref 12–46)
Lymphocytes Relative: 12 % (ref 12–46)
Lymphs Abs: 1.1 10*3/uL (ref 0.7–4.0)
Monocytes Absolute: 0.9 10*3/uL (ref 0.1–1.0)
Monocytes Absolute: 1.1 10*3/uL — ABNORMAL HIGH (ref 0.1–1.0)
Monocytes Relative: 10 % (ref 3–12)
Neutro Abs: 7.7 10*3/uL (ref 1.7–7.7)

## 2011-03-16 LAB — URINE MICROSCOPIC-ADD ON

## 2011-03-16 LAB — SALICYLATE LEVEL: Salicylate Lvl: 1 mg/dL — ABNORMAL LOW (ref 2.8–20.0)

## 2011-03-16 LAB — ETHANOL
Alcohol, Ethyl (B): <5 mg/dL (ref 0–10)
Alcohol, Ethyl (B): <5 mg/dL (ref 0–10)

## 2011-03-16 LAB — ACETAMINOPHEN LEVEL
Acetaminophen (Tylenol), Serum: <10 ug/mL — ABNORMAL LOW (ref 10–30)
Acetaminophen (Tylenol), Serum: <10 ug/mL — ABNORMAL LOW (ref 10–30)

## 2011-03-17 LAB — POCT TOXICOLOGY PANEL

## 2011-03-17 LAB — ACETAMINOPHEN LEVEL
Acetaminophen (Tylenol), Serum: <10 ug/mL — ABNORMAL LOW (ref 10–30)
Acetaminophen (Tylenol), Serum: <10 ug/mL — ABNORMAL LOW (ref 10–30)

## 2011-03-17 LAB — CBC
HCT: 37 % — ABNORMAL LOW (ref 39.0–52.0)
Hemoglobin: 12.5 g/dL — ABNORMAL LOW (ref 13.0–17.0)
Hemoglobin: 12.7 g/dL — ABNORMAL LOW (ref 13.0–17.0)
MCHC: 33.8 g/dL (ref 30.0–36.0)
MCHC: 34.1 g/dL (ref 30.0–36.0)
MCV: 85.2 fL (ref 78.0–100.0)
RBC: 4.34 MIL/uL (ref 4.22–5.81)
RBC: 4.43 MIL/uL (ref 4.22–5.81)
WBC: 10.9 10*3/uL — ABNORMAL HIGH (ref 4.0–10.5)

## 2011-03-17 LAB — DIFFERENTIAL
Basophils Absolute: 0 10*3/uL (ref 0.0–0.1)
Basophils Relative: 0 % (ref 0–1)
Basophils Relative: 0 % (ref 0–1)
Eosinophils Absolute: 0.2 10*3/uL (ref 0.0–0.7)
Eosinophils Relative: 1 % (ref 0–5)
Lymphocytes Relative: 28 % (ref 12–46)
Lymphs Abs: 2.8 10*3/uL (ref 0.7–4.0)
Monocytes Relative: 10 % (ref 3–12)
Neutro Abs: 6.6 10*3/uL (ref 1.7–7.7)
Neutrophils Relative %: 61 % (ref 43–77)
Neutrophils Relative %: 61 % (ref 43–77)

## 2011-03-17 LAB — COMPREHENSIVE METABOLIC PANEL
ALT: 27 U/L (ref 0–53)
BUN: 12 mg/dL (ref 6–23)
CO2: 28 mEq/L (ref 19–32)
Calcium: 9.9 mg/dL (ref 8.4–10.5)
GFR calc non Af Amer: >60 mL/min (ref >60)
Glucose, Bld: 186 mg/dL — ABNORMAL HIGH (ref 70–99)
Sodium: 137 mEq/L (ref 135–145)

## 2011-03-17 LAB — VALPROIC ACID LEVEL
Valproic Acid Lvl: 75.2 ug/mL (ref 50.0–100.0)
Valproic Acid Lvl: 57.8 ug/mL (ref 50.0–100.0)

## 2011-03-17 LAB — SALICYLATE LEVEL: Salicylate Lvl: <1 mg/dL — ABNORMAL LOW (ref 2.8–20.0)

## 2011-03-17 LAB — HEMOCCULT GUIAC POC 1CARD (OFFICE): Fecal Occult Bld: POSITIVE

## 2011-03-25 ENCOUNTER — Emergency Department (HOSPITAL_BASED_OUTPATIENT_CLINIC_OR_DEPARTMENT_OTHER)
Admission: EM | Admit: 2011-03-25 | Discharge: 2011-03-25 | Disposition: A | Discharge status: Home / Self Care | Payer: Medicaid Other | Attending: Emergency Medicine | Admitting: Emergency Medicine

## 2011-03-25 DIAGNOSIS — M549 Dorsalgia, unspecified: Secondary | ICD-10-CM | POA: Insufficient documentation

## 2011-03-25 DIAGNOSIS — F79 Unspecified intellectual disabilities: Secondary | ICD-10-CM | POA: Insufficient documentation

## 2011-03-25 DIAGNOSIS — E119 Type 2 diabetes mellitus without complications: Secondary | ICD-10-CM | POA: Insufficient documentation

## 2011-03-25 DIAGNOSIS — I10 Essential (primary) hypertension: Secondary | ICD-10-CM | POA: Insufficient documentation

## 2011-03-25 DIAGNOSIS — F319 Bipolar disorder, unspecified: Secondary | ICD-10-CM | POA: Insufficient documentation

## 2011-03-25 LAB — URINALYSIS, ROUTINE W REFLEX MICROSCOPIC
Bilirubin Urine: NEGATIVE
Ketones, ur: 15 mg/dL — AB
Nitrite: NEGATIVE
Protein, ur: NEGATIVE mg/dL
Urobilinogen, UA: 1 mg/dL (ref 0.0–1.0)

## 2011-04-14 NOTE — Discharge Summary (Signed)
NAME:  [name], [name] NO.:  [id number]   MEDICAL RECORD NO.:  [id number]          PATIENT TYPE:  IPS   LOCATION:  0406                          [facility]ACILITY[facility]  [facility]   PHYSICIAN:  Anselm Jungling, MD  DATE OF BIRTH:  07-14-1986   DATE OF ADMISSION:  12/30/2008  DATE OF DISCHARGE:  01/02/2009                               DISCHARGE SUMMARY   IDENTIFYING DATA AND REASON FOR ADMISSION:  This was an inpatient  psychiatric admission for [name], a 25 year old single male admitted  due to an overdose of an over-the-counter diarrhea medication.  He came  to [place] with a history of schizoaffective disorder.  Please refer to the  admission note for further details pertaining to the symptoms,  circumstances and history that led to his hospitalization.  He was given  an initial axis I diagnosis of schizoaffective disorder NOS.   MEDICAL AND LABORATORY:  The patient was in good health without any  active or chronic medical problems.  He was medically and physically  assessed by the psychiatric nurse practitioner.  There were no  significant medical issues.   HOSPITAL COURSE:  The patient was admitted to the adult inpatient  psychiatric service.  He presented as a very large male, who appeared to  be healthy, with possible limited IQ.  He was generally pleasant and  cooperative, fully oriented, without any signs or symptoms of psychosis,  thought disorder or delirium.  His insight level appeared to be poor as  he stated on admission that he was admitted because of various physical  complaints.   The patient was continued on his usual regimen of Risperdal and  Depakote.  These were well tolerated.  He had some difficulties with  episodic agitation early on in his stay, but these conditions abated,  and he became consistently calm and pleasant.   On the final hospital day there was a family session involving the  patient and his parents.  Because the patient was unable to return  to  his previous living situation, a great deal of the session was centered  towards developing a structured schedule and routine for the patient.  His parents were very supportive.  The patient was discharged following  this meeting.  He agreed to the following aftercare plan.   AFTERCARE:  The patient was to follow up with [facility] with an appointment on February 8 at 10:15 a.m.   DISCHARGE MEDICATIONS:  1. Risperdal 4 mg p.o. q.h.s.  2. Risperdal Consta 25 mg IM q. 2 weeks.  3. Depakote 1000 mg p.o. q.h.s.  4. Colace 100 mg p.o. daily.   DISCHARGE DIAGNOSES:  AXIS I:  Schizoaffective disorder not otherwise  specified.  AXIS II:  Deferred.  AXIS III:  No acute or chronic illnesses.  AXIS IV:  Stressors severe.  AXIS V:  GAF on discharge 50.   The patient's next Risperdal Consta dose was due on January 04, 2009.      Anselm Jungling, MD  Electronically Signed     SPB/MEDQ  D:  01/09/2009  T:  01/09/2009  Job:  [id number]

## 2011-04-14 NOTE — Discharge Summary (Signed)
NAMEMARQUIST, BINSTOCK NO.:  [id number]   MEDICAL RECORD NO.:  [id number]          PATIENT TYPE:  INP   LOCATION:  1508                         FACILITY:  [facility]   PHYSICIAN:  Peggye Pitt, M.D. DATE OF BIRTH:  July 17, 1986   DATE OF ADMISSION:  10/12/2008  DATE OF DISCHARGE:  10/13/2008                               DISCHARGE SUMMARY   DISCHARGE DIAGNOSES:  1. Suicide attempt.  2. Acetaminophen overdose.  3. Bipolar disorder.  4. Hypertension.  5. Morbid obesity.   DISCHARGE MEDICATIONS:  1. Depakote 1000 mg p.o. at bedtime.  2. Prozac 40 mg p.o. b.i.d.  3. Risperdal 4 mg p.o. at bedtime.  4. Mucomyst 4,690 mg p.o. every 4 hours until November 15th.   DISPOSITION AND FOLLOW UP:  The patient is in stable condition.  He will  be transferred to [facility] for further psychiatric  intervention.   CONSULTATIONS THIS HOSPITALIZATION:  Dr. Jeanie Sewer.   IMAGES PERFORMED THIS HOSPITALIZATION:  None.   HISTORY AND PHYSICAL EXAM:  For full details, please refer to history  and physical dictated by Dr. Lavera Guise on October 12, 2008, but in brief Mr.  Maull is a 25 year old obese white young man with a history of bipolar  disorder who presented to the [facility] Emergency Department stating  that he had taken 48 tablets of Tylenol.  He clearly states that this  was a suicide attempt, he wanted to leave this world.  For this reason  we are called for admission.   HOSPITAL COURSE BY ACTIVE PROBLEM:  1. Acetaminophen overdose.  He was given charcoal in the emergency      department.  We then started him on Mucomyst of which he has about      24 hours left.  His LFTs have all maintained within normal limits      as well as his Protime.  His Tylenol level initially was 58.1.  We      have deemed him stable for transfer to [facility] for      further management.  2. Hypertension.  Has been very well controlled in the hospital even      off  antihypertensives, so I wonder if this is a true diagnosis.  3. Discharge vital signs:  Blood pressure 119/73, heart rate 73,      respirations 20, O2 sat 94% on room air with a temp of 98.4.  4. Labs on day of discharge:  Sodium 139, potassium 3.9, chloride 107,      bicarb 29, BUN 7, creatinine 1.2, and a glucose of 110, total bili      0.7 and alk phos 57, AST 14, ALT 20, total protein 5.7, albumin of      3.1, INR of 1.1, WBC is 5.8, hemoglobin 12.1, and platelets of 298.      Peggye Pitt, M.D.  Electronically Signed     EH/MEDQ  D:  10/13/2008  T:  10/13/2008  Job:  [id number]   cc:   Antonietta Breach, M.D.

## 2011-04-14 NOTE — H&P (Signed)
NAME:  [name], [name] NO.:  [id number]   MEDICAL RECORD NO.:  [id number]          PATIENT TYPE:  IPS   LOCATION:  0301                          FACILITY:  [facility]   PHYSICIAN:  Jasmine Pang, M.D. DATE OF BIRTH:  [birth year]   DATE OF ADMISSION:  10/13/2008  DATE OF DISCHARGE:                       PSYCHIATRIC ADMISSION ASSESSMENT   This is a voluntary admission to the services of Dr. Milford Cage.   IDENTIFYING INFORMATION:  This is a 25 year old albino gentleman.  Apparently he presented on October 11, 2008, to the [facility]  emergency department reporting that he had intentionally ingested 48  Tylenol tablets.  He clearly stated that this was a suicide attempt.  He  wanted to leave the world and for that reason he was admitted.  He was  hospitalized November 13 and November 14 where he was treated with  Mucomyst 4690 mg p.o. every 4 hours to treat his Tylenol overdose.  He  was seen in consultation by Dr. Jeanie Sewer who recommended further  inpatient care and did not adjust his medications.  Essentially his LFTs  became normal as well as his pro-time.  His initial level was 58.1.  He  was found to be stable enough to come to the [facility].  His hypertension was  well-controlled even off of antihypertensives so it was questionable  whether he has a true diagnosis of hypertension or not.  Interestingly  enough it was determined that he had had a similar attempt last week.  He was briefly hospitalized at [facility].  He stated  that his medications for bipolar disorder have not been changed and he  has remained severely depressed and wanting to die and that is why he  overdosed again.   PAST PSYCHIATRIC HISTORY:  Today he states that he was diagnosed at age  46 as being bipolar.  He has a long history for mental health illness  with followup at [facility] in [place].  He states  he was currently under the care of Dr. Dorina Hoyer.   SOCIAL HISTORY:  He went to the 10th grade.  He has a GED.  He lives  with his family including his mother who has bipolar.  He has been  working as a Agricultural consultant at [employer] and lives with his mother, father,  sister, uncle and grandmother.  He denies any Social Security disability  income.   FAMILY HISTORY:  His mother is bipolar.   ALCOHOL AND DRUG HISTORY:  He denies.  There is no evidence for that.   MEDICAL HISTORY AND PRIMARY CARE Josephyne Tarter:  He has no PCP.  His current  psychiatrist he states is Dr. Jeannine Kitten.   MEDICAL PROBLEMS:  Obesity and apparently he does have a history for  hypertension that is not currently being treated.  He is status post two  recent suicide attempts, one by acetaminophen.   MEDICATIONS:  1. He is currently prescribed Depakote 1000 mg at h.s.  2. Prozac 40 mg p.o. daily.  3. Risperdal 4 mg at h.s.  Apparently he is due for a Risperdal  Consta      injection on November 23 but we are not sure of the dose.   DRUG ALLERGIES:  RITALIN.  It makes him more depressed and agitated.   POSITIVE PHYSICAL FINDINGS:  As he was a transfer from the hospital it  is well documented and on the chart.  Vital signs on admission to our  unit show he is 74 inches tall, he weighs 337 pounds.  His temperature  is 97.1, blood pressure is 141/80 to 150/80, pulse 86, respirations are  20.  He has an old scar above his right buttock and an old scar on his  right wrist area.   DIAGNOSES:  AXIS I:  Bipolar disorder depressed with intentional  overdose.  AXIS II:  Rule out personality disorder.  AXIS III:  Morbid obesity.  History for hypertension not treated at  present.  AXIS IV:  Unemployed, no income.  AXIS V:  25.   PLAN:  To admit for safety and stabilization.  Will get his recent  discharge summary from [facility].  We will coordinate and collaborate  with his current psychiatric outside care providers to develop  appropriate changes in medications and  followup.  Estimated length of  stay is 3-5 days.      [name], P.A.-C.      Jasmine Pang, M.D.  Electronically Signed    MD/MEDQ  D:  10/14/2008  T:  10/14/2008  Job:  [id number]

## 2011-04-14 NOTE — H&P (Signed)
NAME:  [name], [name] NO.:  [id number]   MEDICAL RECORD NO.:  [id number]          PATIENT TYPE:  INP   LOCATION:  3712                         FACILITY:  [facility]   PHYSICIAN:  Vania Rea, M.D. DATE OF BIRTH:  1986/06/11   DATE OF ADMISSION:  10/26/2008  DATE OF DISCHARGE:                              HISTORY & PHYSICAL   PRIMARY CARE PHYSICIAN:  Dr. Rosalie Gums at [facility].   PSYCHIATRIST:  Dr. Jacqulyn Ducking at [facility].   CHIEF COMPLAINT:  Acetaminophen overdose.   HISTORY OF PRESENT ILLNESS:  This is a 25 year old African American  gentleman with a history of bipolar disorder and multiple previous  episodes of overdose and suicidal ideation, who sometime between 6 and 7  p.m. this evening overdosed on 20 tablets of extra-strength Tylenol.  The patient says he was feeling depressed and suicidal and wanted to  kill himself.  Of note, the patient said he took the medication in front  of his father and his father called Production designer, theatre/television/film.  It is unclear  exactly what took place between that time and around 8 p.m. when he  started to feel sick and EMS was called.  The patient was taken to the  [facility] emergency room where he was evaluated and found  to have an acetaminophen level of 37.  He was started on N-  acetylcysteine and hospitalist service was called to assist with  management.   Currently, the patient says he is no longer feeling suicidal, but he is  feeling depressed.  He denies any acute problems.  He denies any nausea,  vomiting, diarrhea, abdominal pain or constipation.  He denies any chest  pain or shortness of breath.   PAST MEDICAL HISTORY:  1. Bipolar disorder.  2. Multiple suicide attempts.   MEDICATIONS:  1. Depakote 1000 mg at bedtime.  2. Prozac 40 mg twice daily.  3. Risperdal unknown dose daily.   ALLERGIES:  RITALIN CAUSES DEPRESSION AND SUICIDAL IDEATION.   SOCIAL  HISTORY:  Denies tobacco, alcohol or illicit drug use.  He lives  with his family, he is unemployed.   FAMILY HISTORY:  His mother has bipolar disorder.  He has a sister with  fragile X syndrome.   REVIEW OF SYSTEMS:  A 10-point review of systems other than noted above  is negative.   PHYSICAL EXAMINATION:  GENERAL:  Obese young African American gentleman  lying in bed in no acute distress.  VITAL SIGNS:  Temperature 97.7, pulse 68, respirations 20, blood  pressure 140/96.  He is saturating at 96% on room air.  HEENT:  His pupils are round and equal.  Mucous membranes are pink and  anicteric.  He is not dehydrated.  NECK:  No cervical lymphadenopathy or thyromegaly.  No jugulovenous  distention.  CHEST:  Clear to auscultation bilaterally.  CARDIOVASCULAR:  Regular rhythm without murmur.  ABDOMEN:  Obese, soft and nontender.  EXTREMITIES:  Without edema.  He has 3+ pounding pulses bilaterally.  CENTRAL NERVOUS SYSTEM:  Cranial nerves II-XII are grossly intact and he  has  no focal neurologic deficits.   LABORATORY DATA:  His Depakote level is therapeutic at 56.5.  His urine  drug screen is negative for the tested constituents.  His salicylate  level if undetectable.  His alcohol level is undetectable.  His  acetaminophen level was 37 at 8:15 p.m.  His liver function tests were  unremarkable.  His serum chemistry is unremarkable.  His CBC is likewise  unremarkable.   ASSESSMENT:  1. Acetaminophen overdose.  2. Bipolar disorder with recurrent suicidal ideation.  3. Depression.  4. Hypertension.   PLAN:  Will continue this gentleman on suicide precautions and give N-  acetylcysteine antidote for acetaminophen as per Pharmacy.  When the  patient is medically cleared and has completed the N-acetylcysteine, he  can be transferred yet again to the psychiatric services for continued  management.  Other plans as per ordes.      Vania Rea, M.D.  Electronically Signed      LC/MEDQ  D:  10/26/2008  T:  10/26/2008  Job:  [id number]   cc:   Rosalie Gums, MD  Jacqulyn Ducking, MD

## 2011-04-14 NOTE — Discharge Summary (Signed)
NAME:  [name], [name] NO.:  [id number]   MEDICAL RECORD NO.:  [id number]          PATIENT TYPE:  IPS   LOCATION:  0305                          [facility]ACILITY:  [facility]   PHYSICIAN:  Jasmine Pang, M.D. DATE OF BIRTH:  [birth year]   DATE OF ADMISSION:  10/28/2008  DATE OF DISCHARGE:  10/31/2008                               DISCHARGE SUMMARY   IDENTIFICATION:  This is 26 year old single African American male who  was admitted on a voluntary basis on October 28, 2008.   HISTORY OF PRESENT ILLNESS:  The patient presents with a history of  intentional overdose on 24 Extra Strength Tylenol on Thanksgiving Day.  He states because he had a down the moment.  He denied there were any  specific stressors.  He overdosed and a friend of his father who then  took the patient to the emergency department.  He again denies any  conflict with his parents.  He denies any hallucinations.  Denies any  alcohol or drug use at the time of the overdose.  His sleep has been  satisfactory as well as his appetite.   PAST PSYCHIATRIC HISTORY:  The patient was recently discharged  approximately a week ago for an overdose on Tylenol at that time.  The  patient was to follow up with the [facility] on  November 05, 2008 as well as seeing a Veterinary surgeon, Saverio Danker.  He was  also going to the [facility] in [place].   FAMILY HISTORY:  Mother has a history of bipolar disorder.   ALCOHOL AND DRUG HISTORY:  Denies any alcohol or drug use.   MEDICAL PROBLEMS:  Denies any acute or chronic health issues.   MEDICATIONS:  1. Currently on Depakote 1000 mg at h.s.  2. Prozac 40 mg daily.  3. Risperdal 5 mg at h.s.  4. Risperdal Consta 37.5 mg, was due October 22, 2008, and he did not      receive this.   DRUG ALLERGIES:  Include RITALIN, he states he gets more aggressive when  he takes this.   PHYSICAL EXAM:  The patient was hospitalized at the [facility]  for his Tylenol overdose.  He received Mucomyst treatment for this.  There were no other medical or physical problems at time of transfer to  our unit.   LABORATORY DATA:  Salicylate level was less than 4.  Glucose was 137.  Initial acetaminophen level was 37, less than 10 prior to transfer.  Urine drug screen was negative.  Alcohol level was less than 10 and  valproic acid level was 56.5.   HOSPITAL COURSE:  Upon admission, the patient was continued on Depakote  XR 1000 mg p.o. at bedtime, Prozac 40 mg daily and Risperdal 4 mg at  bedtime.  In individual sessions, the patient was friendly and  cooperative.  He continued to state I just had a down moment.  He took  an overdose of Tylenol in front of his parents.  They had taken him to  [employer] and he bought a bottle while he was  there.  He states he knew  he was going to overdose on it, but there was not any stress or family  conflict.  He is medically stable male.  He goes to [facility] for  psychosocial support and has attempted OD 2 times before.  As  hospitalization progressed, mental status improved.  He became less  depressed, less anxious.  He discussed his impulsive OD.  He still not  sure why he did it.  He has been talking with his parents and they are  very supportive.  He was scheduled for a family session on October 31, 2008.  The patient met with parents for a family session.  They  discussed trying to find some stability for the patient involving some  structure.  Counselors  mentioned putting in place some structured  activities during the day, making a routine chart for the patient to  follow.  He was also suggested that they call about Medicaid  reinstatement, occasional rehab, Volunteering, and in going to [employer].  The patient stated he was willing to try to meet his parents  halfway.  He was also suggested that he had some testing for Asperger  syndrome.  The patient's parents state they have a daughter,  who has  been diagnosed with fragile X syndrome.  The family was very  appreciative with suggestions and very supportive of the patient.  On  October 31, 2008, after the family session, the patient's mood was less  depressed, less anxious.  Affect was consistent with mood.  He was  pleased with the way the family session went.  He felt supported by his  parents.  There was no suicidal or homicidal ideation.  No thoughts of  self-injurious behavior.  No auditory or visual hallucinations.  No  paranoia or delusions.  Thoughts were logical and goal-directed.  Thought content no predominant theme.  Cognitive was grossly intact.  Insight fair.  Judgment fair.  Impulse control good.  It was felt the  patient was safe for discharge today.   DISCHARGE DIAGNOSES:  Axis I:  Bipolar disorder, not otherwise  specified, rule out features of Asperger disorder.  Axis II:  None.  Axis III:  No acute or chronic medical problems noted.  Axis IV:  Other psychosocial problems and burden of psychiatric illness  - moderate.  Axis V:  Global assessment of functioning was 50 upon discharge.  GAF  was 30 upon admission.  GAF highest past year was 65.   DISCHARGE PLANS:  There was no specific activity level or dietary  restrictions.   POSTHOSPITAL CARE PLANS:  The patient will go to the [employer] day  program.  He will be seen by Dr. Jomarie Longs on December 10th at 4 p.m. for a  psychiatry appointment.  He will be seen by his counselor, Saverio Danker  on December 4th at 1 p.m.   DISCHARGE MEDICATIONS:  1. Depakote ER 1000 mg at bedtime.  2. Prozac 40 mg daily.  3. Risperdal 4 mg at bedtime.  4. Risperdal Consta 37.5 mg IM every 14 days, last given October 31, 2008, next due was November 14, 2008.      Jasmine Pang, M.D.  Electronically Signed     BHS/MEDQ  D:  10/31/2008  T:  11/01/2008  Job:  [id number]

## 2011-04-14 NOTE — Discharge Summary (Signed)
NAME:  [name], [name] NO.:  [id number]   MEDICAL RECORD NO.:  [id number]          PATIENT TYPE:  IPS   LOCATION:  0301                          FACILITY:  [facility]   PHYSICIAN:  Jasmine Pang, M.D. DATE OF BIRTH:  [birth year]   DATE OF ADMISSION:  10/13/2008  DATE OF DISCHARGE:  10/18/2008                               DISCHARGE SUMMARY   IDENTIFICATION:  This is a 25 year old single albino male who was  admitted on a voluntary basis On October 13, 2008.   HISTORY OF PRESENT ILLNESS:  The patient has apparently presented on  October 11, 2008, at [facility] Emergency Department reporting that he  had intentionally ingested 48 Tylenol tablets.  He clearly stated that  this was a suicide attempt.  He wanted to leave the world and for that  reason, he was admitted.  He was hospitalized from October 12, 2008 to  October 13, 2008, when he was treated with Mucomyst 4690 mg p.o. every  4 hours to treat his Tylenol overdose.  He was also seen in consultation  by Dr. Jeanie Sewer, who recommended further inpatient care and did not  adjust his medications.  Essentially, his LFTs became normal as well as  his protime.  His initial level was 58.1.  He was found to be stable  enough to come to the [facility].  His hypertension was well-controlled even  off antihypertensive.  So, it was questionable whether he had a true  diagnosis of hypertension or not.  Interestingly enough, it was  determined that he has similar attempt last week.  He was briefly  hospitalized at [facility].  He states his medications  for bipolar disorder have not been changed and he has remained severely  depressed and wanting to die, and that is why overdosed again.   PAST PSYCHIATRIC HISTORY:  On day of admission, he states he was  diagnosed at age 78 as being bipolar.  He has a long history for mental  health illness and was followed at the [facility] in [place].   He states he was currently under the care of Dr.  Latina Craver.   FAMILY HISTORY:  He states his mother has bipolar disorder.   ALCOHOL AND DRUG HISTORY:  The patient denies.  There is no evidence for  that.   MEDICAL PROBLEMS:  History of hypertension that is not currently being  treated.  He is status post 2 recent suicide attempts, one by  acetaminophen.   MEDICATIONS:  He is currently prescribed Depakote 1000 mg at bedtime,  Prozac 40 mg daily, Risperdal 4 mg at bedtime.  Apparently, he has due  for Risperdal Consta injections on October 22, 2008, but we are not  sure of that dose.   DRUG ALLERGIES:  RITALIN (It makes him more depressed and agitated).   PHYSICAL FINDINGS:  As he was transferred in the hospital, the physical  exam as well documented in the chart.  There were no acute physical or  medical problems noted.   Admission laboratories were done while he was  in the hospital, being  stabilized for the overdose.   HOSPITAL COURSE:  Upon admission, the patient was restarted on his  Depakote 1000 mg p.o. q.h.s. and Prozac 40 mg p.o. b.i.d.  He was also  started on Risperdal M-Tab 4 mg at bedtime.  In individual sessions, the  patient was friendly and cooperative.  He was depressed.  He told me  that his grandmother died that she had been very sick.  I later found  out from his family that this had not been the case that no one in his  family has died.  He continued to state he may hurt himself if  discharged because he is so upset about his grandmother.  He was started  on Seroquel 50 mg p.o. q.6 hours p.r.n. anxiety, but this was changed  quickly to Risperdal M-Tab 1 mg p.o. b.i.d. p.r.n. agitation since he  was already on Risperdal.  He was started to become less depressed, less  anxious.  He still was talking about grieving over the loss of his  grandmother even though this was not true, even though she had not died.  He states his father had visited the night  before and this went well.  His sleep was improving and appetite was improving.  On October 17, 2008, the patient was looking forward to going home soon.  He was upset  because he stated his family will pick him up.  They want him to go to  [place] to live with his uncle.  His sleep had been more disturbed  last night after finding this out and he was more depressed and anxious.  He was not suicidal.  On October 18, 2008, the patient stated he felt  great today.  His sleep was good, appetite was good.  Mood was  euthymic.  Affect consistent with mood.  There was no suicidal or  homicidal ideation.  No thoughts of self-injurious behavior.  No  auditory or visual hallucinations.  No paranoia or delusions.  Thoughts  were logical and goal-directed, thought content.  No predominant theme.  Cognitive was grossly back to baseline.  The patient's insight was fair,  judgment fair, impulse control fair.  The patient felt excited about  [facility] in [place].  He is going to attend this program  during the daytime and it was felt this will give him more structure.  He felt ready for discharge.   DISCHARGE DIAGNOSES:  Axis I:  Bipolar disorder, depressed mood, severe  without psychosis.  Axis II:  Features of personality disorder, not otherwise specified.  Rule out borderline or mild mental retardation.  Axis III:  History of hypertension.  Axis IV:  Moderate (Unemployed, no income; burden of psychiatric  illness).  Axis V:  Global assessment of functioning upon discharge was 50.  GAF  upon admission was 25.  GAF highest past year was 55-60.   DISCHARGE PLANS:  There was no specific activity level or dietary  restrictions.   POSTHOSPITAL CARE PLANS:  The patient will go to the [facility] on November 08, 2008, at 4:30 p.m.  He will  also see Saverio Danker, counselor on November 02, 2008, at 1 p.m.  He is  scheduled for return to see Maxcine Ham, our  case manager on  October 23, 2008, at 3 p.m. to help him to assist with any problems  post discharge.   DISCHARGE MEDICATIONS:  1. The patient was prescribed the  Lamisil Cream to areas of rash twice      a day for a week if the trash is no better.  He states he has      family doctor.  2. Depakote 1000 mg at bedtime.  3. Colace 100 mg daily.  4. Prozac 40 mg twice daily.  5. Risperdal 4 mg at bedtime.  Continue Risperdal Consta dose at      [facility], which has due on October 22, 2008.   Valproate level October 13, 2008, was 88.4.      Jasmine Pang, M.D.  Electronically Signed     BHS/MEDQ  D:  10/20/2008  T:  10/21/2008  Job:  [id number]

## 2011-04-14 NOTE — Consult Note (Signed)
NAMEMACKEY, VARRICCHIO NO.:  [id number]   MEDICAL RECORD NO.:  [id number]          PATIENT TYPE:  IPS   LOCATION:  0301                          FACILITY:  [facility]   PHYSICIAN:  Antonietta Breach, M.D.  DATE OF BIRTH:  04-11-1986   DATE OF CONSULTATION:  10/12/2008  DATE OF DISCHARGE:                                 CONSULTATION   REASON FOR CONSULTATION:  Suicide attempt.   REQUESTING PHYSICIAN:  [facility].   HISTORY OF PRESENT ILLNESS:  [name] is a 25 year old male admitted  to the [facility] on October 12, 2008 due to an overdose.   [name] has been experiencing several weeks (at least 4) of decreased  energy, anhedonia, as well as difficulty with concentration.  On  November 6 he had to fake a job interview.  He has had thoughts of  hopelessness.  He has made actually 2 overdoses over the past 4 days  prior to admission.  His first overdose with Tylenol was on October 05, 2008; he followed those up with a dose up with a second overdose on  October 11, 2008.   He has been taking psychotropic medications prior to admission,  including Depakote, Prozac and Risperdal.  He is unsure of the dosages;  however, his mood episode recurred.   His orientation and memory function are intact.  He is cooperative with  bedside care.   PAST PSYCHIATRIC HISTORY:  He does have a history of a previous  overdose.  He describes a mixture of mood symptoms that occur when he is  having an exacerbation.  He will be happy for a brief moment, and then  very angry the next.  When he is feeling happy he does have increased  energy, as well as increased activity when he is depressed.  He also has  insomnia as well as decreased appetite.  He, however, does pace when he  is irritable.  He has been in psychiatric care, as mentioned above.   FAMILY PSYCHIATRIC HISTORY:  None known.   SOCIAL HISTORY:  [name] is single.  He is not using any alcohol.  He  has no  children and he currently is unemployed.   PAST MEDICAL HISTORY:  Status post Tylenol overdose with Tylenol  toxicity.   PAST PSYCHIATRIC HISTORY:  The patient underwent a psychiatric admission  in August 2008.  He also underwent one in May 2009.  He was at [facility], and just recently discharged from their psychiatric  inpatient unit on October 02, 2008.  He does have no history of  hallucinations.   MEDICATIONS:  The MAR is reviewed.  1. He is on a Mucomyst protocol.  2. Depakote 1000 mg at bedtime.  3. Prozac 40 mg b.i.d.  The Prozac is at his prior-to-admission dosage, as well as the Depakote.   ALLERGIES:  RITALIN (made him violent with suicidal ideation when he was  25 years old).   LABORATORY DATA:  WBC 10.1, hemoglobin 13.3, platelet count 333.  Sodium  141, BUN 10, creatinine 1.08, glucose 121.  His  Tylenol level was 58.1  upon first arrival to the ER.  His Depakote level was elevated at  1004.7.  SGOT 21, SGPT 29, INR 1.0.  Alcohol level was negative.   REVIEW OF SYSTEMS:  Constitutional; Head, Eyes, Ears, Nose, Throat,  Mouth, Neurologic, Psychiatric Cardiovascular, Respiratory,  Gastrointestinal, Genitourinary, Musculoskeletal, Hematologic,  Lymphatic, Endocrine and Metabolic were all unremarkable.   EXAMINATION:  VITAL SIGNS:  Temperature 98.3, pulse 98, respiratory rate  20, blood pressure 142/85.  GENERAL APPEARANCE:  [name] is a young male, lying in a supine  position in his hospital bed with no abnormal involuntary movements.  He  is cooperative with the interview.   MENTAL STATUS EXAM:  [name] is alert.  His eye contact is good.  He  does have downcast facies.  His concentration is decreased.  His affect  is constricted.  Mood is depressed.  He is oriented to all spheres.  His  memory is intact to immediate, recent and remote.  His fund of knowledge  and intelligence are within normal limits.  His speech is soft.  He does  have normal rate  and prosody without dysarthria.  Thought process is  logical, coherent and goal-directed.  No looseness of associations.  Thought content,  he does acknowledge suicidal intent.  He has no  hallucinations or delusions.  His insight is poor at this time and his  judgment is impaired.   ASSESSMENT:  AXIS I:  (293.83)  1. Mood disorder not otherwise specified, depressed.  2. Rule out bipolar disorder not otherwise specified, mixed episode.  AXIS II:  Deferred  AXIS III:  Status post Tylenol overdose with Tylenol toxicity.  On  Mucomyst protocol.  AXIS IV:  Primary support group.  AXIS V:  30.   [name] is still at risk to harm himself.   The undersigned provided ego supportive psychotherapy and education.   RECOMMENDATIONS:  1. Will not change the Depakote, as this is his primary mood      stabilizer.  We will also not change the Prozac at this time.      However, psychotropic medication changes can be deferred to his      inpatient psychiatric admission.  2. Would continue suicidal precautions.  3. Would admit to an inpatient psychiatric ward as soon as possible.  4. Would monitor for nausea regarding Depakote and Prozac in the      context of his Tylenol overdose, as the Depakote and Prozac can      both cause nausea.  5. Would continue ego support.  6. Would admit to an inpatient psychiatric ward as soon as possible.      Antonietta Breach, M.D.  Electronically Signed    JW/MEDQ  D:  10/15/2008  T:  10/15/2008  Job:  [id number]

## 2011-04-14 NOTE — H&P (Signed)
NAME:  [name], PURSIFULL NO.:  [id number]   MEDICAL RECORD NO.:  [id number]          PATIENT TYPE:  IPS   LOCATION:  0305                          FACILITY:  [facility]   PHYSICIAN:  Jasmine Pang, M.D. DATE OF BIRTH:  08/14/1986   DATE OF ADMISSION:  10/28/2008  DATE OF DISCHARGE:                       PSYCHIATRIC ADMISSION ASSESSMENT   This is a 25 year old male that was voluntarily admitted on October 28, 2008.   HISTORY OF PRESENT ILLNESS:  The patient presents with a history of  intentional overdose overdosing on 24 extra strength Tylenol on  Thanksgiving Day.  States because he had a down moment.  He denied  that there were any specific stressors.  He overdosed and a friend of  his father who thus had taken patient to the emergency apartment.  He  again denies any conflict with his parents.  He denies any  hallucinations.  Denies any alcohol or drug use at the time of the  overdose.  His sleep has been satisfactory, as well as his appetite.  Again, denies any hallucinations.   PAST PSYCHIATRIC HISTORY:  Patient was recently discharged approximately  a week ago for an overdose on Tylenol at that time.  Patient was to  follow up with [facility] on November 05, 2008, as  well as seeing a counselor named Abigail Miyamoto.   SOCIAL HISTORY:  A 25 year old male who lives in [place] with his  parents.  He has his GED.   FAMILY HISTORY:  Mother with bipolar disorder.   ALCOHOL AND DRUG HISTORY:  Denies any alcohol or drug use.   PRIMARY CARE Shantrice Rodenberg:  None.   MEDICAL PROBLEMS:  Denies any acute or chronic health issues.   MEDICATIONS:  Currently on:  1. Depakote 1000 mg at h.s.  2. Prozac 40 mg daily.  3. Risperdal 4 mg at h.s.  4. Risperdal Consta injection that he states was due on October 22, 2008, that he did not receive.   DRUG ALLERGIES:  RITALIN.  HE STATES HE HAS AN ADVERSE SIDE EFFECT,  THAT HE GETS MORE AGGRESSIVE.   PHYSICAL EXAM:  Patient was hospitalized at [facility] for Mucomyst  treatment after his Tylenol overdose.  No significant physical findings.  He is an obese young male who is in no distress.  Offers no complaints  at this time.  Temperature is 98, 76 heart rate, 20 respirations, blood  pressure is 119/73, 6 feet 3 inches tall, 344 pounds.   LABORATORY DATA:  Shows a salicylate level less than 4, glucose of 137,  initial acetaminophen level was 37, less than 10 prior to transfer.  Urine drug screen was negative.  Alcohol level was less than 10 and  valproic acid level was 56.5.   MENTAL STATUS EXAM:  Patient is resting in bed.  Fully alert and is  cooperative during the interview.  He is disheveled with poor eye  contact.  His speech is soft-spoken, normal pace.  His mood is neutral.  Patient's affect is depressed.  Thought processes are coherent.  He is  asking to go home.  He feels he is no longer suicidal and knows how to  avoid this happening again.  No delusional statements.  Cognitive  functioning, his insight is limited.  He seems to have some cognitive  limitations.  Poor impulse control.   AXIS I:  Mood disorder, rule out bipolar disorder.  AXIS II:  Deferred.  AXIS III:  1. Status post Tylenol overdose.  2. Morbid obesity.  AXIS IV:  Other psychosocial problems.  AXIS V:  Current is 30.   PLAN:  Contract for safety.  We will resume his Depakote, Prozac, and  Risperdal.  We will clarify his Risperdal Consta dose and frequency.  We  will contact family for returning to living arrangements and any other  concerns.  Reinforce medication compliance and coping skills.  His  tentative length of stay at this time is 3 or 4 days.      Landry Corporal, N.P.      Jasmine Pang, M.D.  Electronically Signed    JO/MEDQ  D:  10/29/2008  T:  10/29/2008  Job:  [id number]

## 2011-04-14 NOTE — H&P (Signed)
NAME:  [name], [name] NO.:  [id number]   MEDICAL RECORD NO.:  [id number]          PATIENT TYPE:  IPS   LOCATION:  0405                          FACILITY:  [facility]   PHYSICIAN:  Geoffery Lyons, M.D.      DATE OF BIRTH:  Jun 27, 1986   DATE OF ADMISSION:  01/24/2009  DATE OF DISCHARGE:                       PSYCHIATRIC ADMISSION ASSESSMENT   IDENTIFYING INFORMATION:  This is a 25 year old male.  This is a  voluntary admission.   HISTORY OF THE PRESENT ILLNESS:  This is one of several [facility] admissions  for this 25 year old who initially presented in the emergency room after  making a cut on the dorsal surface of his left hand, said that he was  upset and he was trying to get to the vein in there.  He had had an  argument with his father that he says began when he made his father  upset by making references to his grandfather who is deceased.  He said  his father got riled up, was yelling at him on, calling him a lot of  demeaning names, and he was upset.  He was discharged home after  suturing the laceration and was given a prescription for Vicodin.  After  returning home, apparently the conflict resumed and he made statements  threatening that he was going to take the bottle of Vicodin and just  kill himself so his parents returned him to the emergency room.  Today,  he admits that he was upset, says that he did not know by making  statements about his grandfather that that would set off his father's  temper.  His mother had tried to quiet things down but was unsuccessful  and [name] reports that his father had to leave the home temporarily  for things to calm down.  His father has subsequently apologized to him.  He denies that he wants to kill himself today.  He is ashamed of the  fact that he cut his hand and initially told the nurses that he had had  a biopsy done of a mole.  He is agreeing to have a family session with  his parents, wants to return there.  Says he does  feel safe there and  generally his relationship with his parents is good.   PAST PSYCHIATRIC HISTORY:  This is the 5th [facility] admission for this 45-  year-old with a history of schizoaffective disorder.  He reports that  his mother helps him manage his medications.  He has a very basic  education and a history of limited intellectual capacity.  He has a  history of multiple suicide attempts by cutting and a history of some  poor impulse control.   SOCIAL HISTORY:  Single male, never married.  No children.  Living with  his 2 parents and his 32 year old sister who is handicapped with Fragile  X syndrome.  Family lives in the home with a grandmother who resides  with them who is apparently a bilateral amputee with multiple chronic  illnesses and the patient says he also assists with her care.  He has 1  older sibling,  a brother, who was adopted who lives on his own in [place].  Patient has no legal problems.  No substance abuse.   FAMILY HISTORY:  Remarkable for sister with Fragile X syndrome.   MEDICAL HISTORY:  Primary care [name] is Dr. Paulino Door.   CURRENT MEDICAL PROBLEMS:  Sutured laceration of his left hand.   PAST MEDICAL HISTORY:  He has had a history of transient elevated blood  pressure in the past and previous suicide attempts.   CURRENT MEDICATIONS:  1. Risperdal Consta 25 mg every 14 days, was last due on January 04, 2009, but last date received is unclear.  2. Risperdal 4 mg p.o. q.h.s.  3. Prozac 40 mg p.o. q.h.s.  4. Depakote extended release 1000 mg p.o. q.h.s.  5. He was prescribed Vicodin yesterday 5/325 mg p.o. one t.i.d. p.r.n.      for pain and it is unclear if he actually had a dermatological      procedure or only a self-inflicted laceration.  6. He received a DTP booster in the emergency room.   PHYSICAL EXAM:  This is a large-built male in no distress.  His blood  pressure is normal along with his other vital signs.  Fully alert and   cooperative.  A valproate level was 57.8 compared to 75 at his last  discharge on January 02, 2009, and is acetaminophen level was negative.   MENTAL STATUS EXAM:  Fully alert, polite, cooperative.  Good eye  contact.  Gives a coherent history.  Soft spoken.  Speech is normal.  He  is fully cooperative, in full contact with reality.  Mood is  embarrassed, rather ashamed of what happened, says that he feels better  now that his father has apologized to him.  Denying suicidal or  homicidal thoughts.  Cognition is intact.   AXIS I:  1. Acute adjustment reaction with underlying mood disorder.  2. Schizoaffective disorder.  AXIS II:  Deferred.  AXIS III:  Laceration, left hand, sutured, healing.  AXIS IV:  Deferred.  AXIS V:  Current 56, past year 71, estimated.   PLAN:  Voluntarily admit with every 15 minute checks in place.  We are  going to validate his Risperdal Consta dose and we will get in touch  with his parents, hope to have a family session or at least hear from  them.  He reports that his mother manages his medications and we will  try to coordinate with her.      Margaret A. Scott, N.P.      Geoffery Lyons, M.D.  Electronically Signed    MAS/MEDQ  D:  01/24/2009  T:  01/24/2009  Job:  [id number]

## 2011-04-14 NOTE — H&P (Signed)
NAME:  [name], [name] NO.:  [id number]   MEDICAL RECORD NO.:  [id number]          PATIENT TYPE:  INP   LOCATION:  1422                         FACILITY:  [facility]   PHYSICIAN:  Lonia Blood, M.D.       DATE OF BIRTH:  02/01/1986   DATE OF ADMISSION:  10/12/2008  DATE OF DISCHARGE:                              HISTORY & PHYSICAL   PRIMARY CARE PHYSICIAN:  This patient does not have a primary care  physician.   CHIEF COMPLAINT:  I tried to kill myself.   HISTORY OF PRESENT ILLNESS:  [name] is a 25 year old gentleman with  a history of bipolar disorder who presented to [facility]  Emergency Room on October 11, 2008 at 2204 p.m., saying that he took 48  tablets of Tylenol.  The patient had a similar attempt last week and he  was briefly hospitalized a [facility].  He said  that his medications for bipolar disorder have not been changed and he  remained severely depressed and he wanted to die within a week after  ingestion of this medication.   PAST MEDICAL HISTORY:  Bipolar disorder and suicide attempts.   HOME MEDICATIONS:  1. Depakote 1000 mg at bedtime.  2. Prozac 40 mg twice a day.  3. Risperdal, unknown dose, at bedtime.   ALLERGIES:  To RITALIN.   SOCIAL HISTORY:  The patient denies smoking cigarettes.  Denies drinking  alcohol.  Denies illicit drugs.  Has a GED education.  He is unemployed.  He has been working as a Agricultural consultant at [employer].  He lives with his  mother, father, sister, uncle and grandmother.   FAMILY HISTORY:  The patient's mother has bipolar disorder.   REVIEW OF SYSTEMS:  The patient denies headaches, fevers, chills, chest  pain, shortness of breath, abdominal pain, nausea, vomiting, dysuria.  All other systems reviewed and negative.   PHYSICAL EXAM UPON ADMISSION:  Temperature 98.3, blood pressure 142/85,  pulse 98, respirations 20, saturation 98% on room air.  GENERAL APPEARANCE:  A morbidly  obese African American gentleman,  somnolent, but oriented to place, person and time.  HEAD:  Normocephalic, atraumatic.  Eyes:  Pupils equal and round, react  to light and accommodation. Extraocular movements intact.  Throat clear.  NECK:  Supple.  No JVD.  CHEST: Clear without wheezes, rhonchi or crackles.  HEART: Regular rate and rhythm without murmurs, rubs or gallops.  ABDOMEN: Soft, nontender, nondistended.  Bowel sounds are present.  LOWER EXTREMITIES: With +1 edema.   LABORATORY VALUES ON ADMISSION:  White blood cell count is 10.1,  hemoglobin 13.3, platelet count 333.  INR is 0.9.  Sodium 140, potassium  3.8, chloride 101, bicarbonate 26, BUN 12, creatinine 0.9.  Alcohol  level is less than 10.   ASSESSMENT/PLAN:  This is a 22-year gentleman with a suicide attempt by  ingesting 24 g of acetaminophen.   Our plan is to obtain a stat acetaminophen level and start the Mucom[facility]  protocol.  I would go ahead and treat him, as he is now 4 hours after  ingestion, and I would rather give him treatment than wait for the  level.  If the return level is very low, we can always discontinue the  treatment.  I will closely monitor the patient's LFTs, PT/INR, look for  any signs of hepatic injury.  For the patient's bipolar disorder and  suicide attempt, I will definitely observe him closely, place him on one-  on-one precautions with a sitter and obtain psychiatric consultation in  the morning.  Since the patient is taking Risperdal which is known to  prolong QT interval, I will obtain an EKG.      Lonia Blood, M.D.  Electronically Signed     SL/MEDQ  D:  10/12/2008  T:  10/12/2008  Job:  [id number]

## 2011-04-14 NOTE — H&P (Signed)
NAMEJEREMEY, BASCOM NO.:  [id number]   MEDICAL RECORD NO.:  [id number]          PATIENT TYPE:  IPS   LOCATION:  0406                          [facility]ACILITY[facility]  [facility]   PHYSICIAN:  Anselm Jungling, MD  DATE OF BIRTH:  1986-09-14   DATE OF ADMISSION:  12/30/2008  DATE OF DISCHARGE:                       PSYCHIATRIC ADMISSION ASSESSMENT   IDENTIFICATION:  This is a 25 year old male.  This is a voluntary  admission.   HISTORY OF PRESENT ILLNESS:  This is one of several [facility] admissions for  this 25 year old who presented in the emergency room after taking 20  tablets of Lomotil in an attempt to kill himself.  He does state that it  was an intentional overdose and says I was really mad at myself, but  he is unable to tell me why.  He does endorse that he has been under  additional stress lately after being evicted from the [employer] program  about 2 weeks ago.  At that time his mother has reported he was doing  very well in that day program and was beginning to actually work in  their work program, then he at some point threatened suicide and was  placed under a Engineer, manufacturing systems.  Subsequently, about 2 weeks ago then he  did in fact overdose and was admitted to [facility] and then  evicted from [place] for breaking his Engineer, manufacturing systems. He also says that  he was not taking his medications regularly at that time.  On further  questioning he also admits to feeling a little stressed and anxious  after he and his mother have moved in to their grandmother's home to  care for her as  she is very sick.  He denies any auditory  hallucinations today. He has been attentive and cooperative.   PAST PSYCHIATRIC HISTORY:  No known history of substance abuse.  This is  one of multiple [facility] admissions with his last admission being November 29  to October 31, 2008 after a Tylenol overdose.  He has a history of  several overdose attempts,  most recently this month earlier and was  admitted to [facility]. He is followed as an outpatient by  Shelly Coss, MD at [facility], area code [phone], and Berton Mount who is his psychotherapist.  He has a history of mood  disorder with some psychotic symptoms and possibly schizoaffective  disorder.  No known history of substance abuse.   SOCIAL HISTORY:  Single male living at home with his mom. He and his  mother are living together in the grandmother's home.  His mother is the  primary caregiver for his grandmother who is severely ill. Nikholas's  mother in fact is legally blind herself, and she has contributed to his  history.  She requests that if possible he be placed in a day program  where he had done very well before and that having idle time being at  home in the atmosphere his grandmother being sick just increases his  anxiety.   FAMILY HISTORY:  Denies a family history of mental illness or substance  abuse.  MEDICAL HISTORY:  Primary care physician is Derrill Memo, MD. Medical  problems are obesity and he has past history of elevated blood pressure.  Current medications are unclear. According to his mother he had not  taken any Risperdal Consta, but Kishaun himself says that he did receive  an injection at [facility] about 2 weeks ago.  She reports his  Depakote was decreased from 1000 mg q.h.s. to 500 mg q.h.s. for the past  week, and he is on Risperdal 4 mg p.o. q.h.s.   DRUG ALLERGIES:  None.  He has had issues with aggressive behavior when  taking RITALIN.   PHYSICAL EXAMINATION:  Physical exam was done in the emergency room as  noted in the record.  GENERAL APPEARANCE:  This is a tall, large-built, African-American male,  5 feet 3 inches tall, 347 pounds.  VITAL SIGNS:  Temperature 97, pulse 71, respirations 18, blood pressure  111/75.   Diagnostic studies were done in the emergency room where his CBC WBC 10,  hemoglobin 12.5, hematocrit 37.2, platelets 334,000  and MCV 83.9.  Routine urinalysis was completely within normal limits.  The  acetaminophen level was less than 10.  Chemistry sodium 141, potassium  4.3, chloride 103, carbon dioxide 29, BUN 12, creatinine 1, random  glucose 90.  Liver enzymes SGOT 43, SGPT 39, alkaline phosphatase 74,  total bilirubin is 0.6.  Urine drug screen is negative for all  substances.   MENTAL STATUS EXAM:  Fully alert male.  He is completely calm, coherent,  with good insight today. Says I feel much better today. He is unable  to explain why he felt suicidal yesterday. He has admitted that he was  not taking his medications very regularly prior to his admission at [facility] a couple of weeks ago, not sure when he had his last  Consta injection or when it is due.  He feels that speech his is normal,  nonpressured.  Gives a fairly coherent history. Mood is neutral today.  Thought process logical, does not appear to be responding to internal  stimuli.  No delusional statements made.  Immediate, recent, and remote  memory are intact.   DIAGNOSES:  AXIS I:  Bipolar disorder with psychosis versus  schizoaffective disorder.  AXIS II:  Deferred.  AXIS III:  No diagnosis.  AXIS IV:  Severe issues with social isolation and idle time, having  stable home and a supportive mother is an asset to him.  AXIS V:  Current 34. Past year not known.   PLAN:  To voluntarily admit with every 15-minute checks in place.  Currently he is on our intensive care unit but is attending our grief  and loss group.  We are going to continue his Risperdal, and we will  validate his most recent medications and routine for his Risperdal  Consta injection with [facility]. His mother today  in phone conference contributed to his assessment, and we have ordered  records from [facility].      Claris Che A. Lorin Picket, N.P.      Anselm Jungling, MD  Electronically Signed    MAS/MEDQ  D:  12/31/2008   T:  12/31/2008  Job:  [id number]

## 2011-04-14 NOTE — Discharge Summary (Signed)
[name], [name] NO.:  [id number]   MEDICAL RECORD NO.:  [id number]          PATIENT TYPE:  INP   LOCATION:  3712                         FACILITY:  [facility]   PHYSICIAN:  Peggye Pitt, M.D. DATE OF BIRTH:  Nov 18, 1986   DATE OF ADMISSION:  10/26/2008  DATE OF DISCHARGE:  10/27/2008                               DISCHARGE SUMMARY   DISCHARGE DIAGNOSES:  1. Suicide attempt.  2. Acetaminophen overdose.  3. Bipolar disorder.  4. Morbid obesity.  5. Hypertension.   DISCHARGE MEDICATIONS:  1. Depakote 1000 mg p.o. at bedtime.  2. Prozac 40 mg p.o. b.i.d.  3. Risperdal 4 mg at bedtime.   DISPOSITION AND FOLLOWUP:  Patient is now stable condition.  He is still  waiting for psychiatric evaluation but I suspect they will transfer him  to [facility] for further psychiatric management given the fact  that he has had 2 acetaminophen overdoses as suicide attempts in the  past month.   CONSULTATIONS THIS HOSPITALIZATION:  We are still waiting for  psychiatric evaluation by Dr. Milford Cage.   IMAGES THIS HOSPITALIZATION:  None.   HISTORY AND PHYSICAL EXAM:  For full details please refer to history and  physical dictated by Dr. Orvan Falconer on October 26, 2008 but in brief Mr.  Olson is a 25 year old African American man with a history bipolar  disorder who at 6:00 p.m. on the night of  the 27th had took 20 tablets  of Extra Strength Tylenol as a suicide attempt.  Patient states he is  feeling depressed and wanted to harm himself.  His father called [facility] who advised admission to the emergency department.  His initial  Tylenol level was 37.  For this reason we were called for admission.   HOSPITAL COURSE BY ACTIVE PROBLEM:  1. A suicide attempt by the way of an acetaminophen overdose.  The      patient was given charcoal in the emergency department.  Was also      placed on N-acetylcysteine drip.  All of his LFTs have maintained      within  normal limits as well as his ProTime.  We are still waiting      for psychiatric consultation but I suspect he will go to inpatient      Behavioral Health for further psychiatric management.  2. Bipolar disorder.  We will continue with his current psychotropic      agents. Any changes are to be made by Psychiatry.  3. Hypertension.  He maintained excellent blood pressure while in the      hospital without being on any antihypertensives.  For this reason I      wonder if this is a true diagnosis.  4. Vital signs on day of discharge:  Blood pressure 131/86, heart rate      78, respirations 20, O2 sats 95% on room air with a temperature of      97.7.  5. Labs on day of discharge:  Sodium 137, potassium 4, chloride 105,      bicarb 22, BUN 12, creatinine 0.84 with a glucose  of 135, total      bili 0.4, alk phos 44, AST 44, ALT 18, total protein 5.9, and an      albumin of 3.2 with a Tylenol level of less than 10.      Peggye Pitt, M.D.  Electronically Signed     EH/MEDQ  D:  10/27/2008  T:  10/27/2008  Job:  [id number]
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
## 2011-04-17 NOTE — Discharge Summary (Signed)
NAME:  [name], [name] NO.:  [id number]   MEDICAL RECORD NO.:  [id number]          PATIENT TYPE:  IPS   LOCATION:  0405                          [facility]ACILITY:  [facility]   PHYSICIAN:  Jasmine Pang, M.D. DATE OF BIRTH:  06/09/1986   DATE OF ADMISSION:  01/24/2009  DATE OF DISCHARGE:  01/26/2009                               DISCHARGE SUMMARY   IDENTIFICATION:  This is a 25 year old single male who has a history of  schizoaffective disorder.  He was admitted on a voluntary basis on  January 24, 2009.   HISTORY OF PRESENT ILLNESS:  The patient presented to the emergency room  after making a cut on the dorsal surface of his left hand.  He said that  he was upset and was trying to get to the vein in there.  For further  admission information, see psychiatric admission assessment.   PHYSICAL FINDINGS:  There were no acute physical or medical problems  noted.  Valproate was 57.8 compared to 75 at his last discharge on  January 02, 2009.   HOSPITAL COURSE:  Upon admission, the patient was started on Depakote ER  1000 mg at bedtime, Risperdal 4 mg at h.s., Prozac 40 mg at h.s., and  Vicodin 5/325 mg 1 tablet t.i.d. p.r.n. pain in his wrist, these did  discontinue after 72 hours.  He was also started on Ambien 5 mg p.o.  q.h.s. p.r.n. and it was noted that he was also on the Risperdal Consta  injections and was given 25 mg IM on January 25, 2009 and then every 14  days.  He was also started on Keflex 500 mg p.o. q.i.d. x5 days for skin  infection of the left hand.  In individual sessions, the patient was  somewhat irritable and angry.  He was upset because the staff would not  rewrap his hand with the stitches on it.  This was done by a staff  member and not long after he complained about it.  The patient continued  to be irritable, but this improved as hospitalization progressed.  His  [facility] team made contact with the patient.  On January 26, 2009,  counselor met  with the patient and the patient's father and mother over  the phone, and the patient's sister for family session.  The patient  stated he was feeling good and has no suicidal ideation.  He does not  know what led him coming here.  Father reports the patient has tried  multiple times to get admitted to [facility] and finally called a cab and to  get to the ER with no money to pay for the cab.  The patient is unable  to say why he wanted to be admitted.  He denies any stressors at home.  The patient says family is supportive and is nothing they could do  differently.  The father says this is the last time the patient should  cut himself.  He says he wants the patient to cope by writing in his  journal or talking to someone.  [facility] was discussed as an  option for the patient during this stay.  Mother stated she would look  into.  Mother is working on disability and medicate for the patient.  Parents had no concern about the patient's being discharged today and do  not feel as a threat to himself or anyone else.  His mood was improved,  less irritable, less depressed, less anxious.  Affect was consistent  with mood.  There was no suicidal or homicidal ideation.  No thoughts of  self-injurious behavior.  No auditory or visual hallucinations.  No  paranoia or delusions.  Thoughts were logical and goal-directed.  Thought content, no predominant theme.  Cognitive was grossly intact.  Insight fair.  Judgment fair.  Impulse control fair.  It was felt the  patient was safe for discharge today.   DISCHARGE DIAGNOSES:  Axis I:  Schizoaffective disorder, bipolar type.  Axis II:  Features of borderline personality disorder.  Axis III:  Laceration left hand, sutured and healing.  Axis IV:  Moderate (problems with primary support group, burden of  psychiatric illness).  Axis V:  Global assessment of functioning is 55 upon discharge.  GAF was  45 upon admission.  GAF highest past year was 62.    DISCHARGE PLAN:  There was no specific activity level or dietary  restrictions.   POSTHOSPITAL CARE PLANS:  The patient will be seen by [facility] team  on January 28, 2009 at 12 o'clock noon.   DISCHARGE MEDICATIONS:  1. Risperdal Consta 25 mg IM given on 01/25/2009, due again in 14      days.  2. Depakote ER 1000 mg p.o. q.h.s.  3. Risperdal 4 mg at bedtime.  4. Prozac 40 mg daily.  5. Keflex 500 mg 4 times a day, to be finished on January 30, 2009.      Jasmine Pang, M.D.  Electronically Signed     BHS/MEDQ  D:  02/21/2009  T:  02/22/2009  Job:  [id number]

## 2011-05-14 ENCOUNTER — Emergency Department (HOSPITAL_BASED_OUTPATIENT_CLINIC_OR_DEPARTMENT_OTHER)
Admission: EM | Admit: 2011-05-14 | Discharge: 2011-05-14 | Disposition: A | Discharge status: Home / Self Care | Payer: Medicaid Other | Attending: Emergency Medicine | Admitting: Emergency Medicine

## 2011-05-14 ENCOUNTER — Emergency Department (INDEPENDENT_AMBULATORY_CARE_PROVIDER_SITE_OTHER): Payer: Medicaid Other

## 2011-05-14 DIAGNOSIS — F411 Generalized anxiety disorder: Secondary | ICD-10-CM | POA: Insufficient documentation

## 2011-05-14 DIAGNOSIS — E119 Type 2 diabetes mellitus without complications: Secondary | ICD-10-CM | POA: Insufficient documentation

## 2011-05-14 DIAGNOSIS — I10 Essential (primary) hypertension: Secondary | ICD-10-CM | POA: Insufficient documentation

## 2011-05-14 DIAGNOSIS — F319 Bipolar disorder, unspecified: Secondary | ICD-10-CM | POA: Insufficient documentation

## 2011-05-14 DIAGNOSIS — R079 Chest pain, unspecified: Secondary | ICD-10-CM

## 2011-05-14 DIAGNOSIS — R071 Chest pain on breathing: Secondary | ICD-10-CM | POA: Insufficient documentation

## 2011-05-14 LAB — BASIC METABOLIC PANEL
Calcium: 10.1 mg/dL (ref 8.4–10.5)
GFR calc non Af Amer: >60 mL/min (ref >60)
Glucose, Bld: 130 mg/dL — ABNORMAL HIGH (ref 70–99)
Sodium: 138 mEq/L (ref 135–145)

## 2011-05-14 LAB — D-DIMER, QUANTITATIVE: D-Dimer, Quant: <0.22 ug/mL-FEU (ref 0.00–0.48)

## 2011-05-14 LAB — DIFFERENTIAL
Basophils Absolute: 0.1 10*3/uL (ref 0.0–0.1)
Basophils Relative: 0 % (ref 0–1)
Eosinophils Relative: 1 % (ref 0–5)
Monocytes Absolute: 1 10*3/uL (ref 0.1–1.0)

## 2011-05-14 LAB — CBC
HCT: 39.2 % (ref 39.0–52.0)
MCHC: 34.2 g/dL (ref 30.0–36.0)
RDW: 13.6 % (ref 11.5–15.5)

## 2011-06-23 ENCOUNTER — Emergency Department (INDEPENDENT_AMBULATORY_CARE_PROVIDER_SITE_OTHER): Payer: Medicaid Other

## 2011-06-23 ENCOUNTER — Encounter: Payer: Self-pay | Admitting: *Deleted

## 2011-06-23 ENCOUNTER — Emergency Department (HOSPITAL_BASED_OUTPATIENT_CLINIC_OR_DEPARTMENT_OTHER)
Admission: EM | Admit: 2011-06-23 | Discharge: 2011-06-24 | Disposition: A | Discharge status: Home / Self Care | Payer: Medicaid Other | Attending: Emergency Medicine | Admitting: Emergency Medicine

## 2011-06-23 DIAGNOSIS — R109 Unspecified abdominal pain: Secondary | ICD-10-CM

## 2011-06-23 DIAGNOSIS — K7689 Other specified diseases of liver: Secondary | ICD-10-CM

## 2011-06-23 DIAGNOSIS — F319 Bipolar disorder, unspecified: Secondary | ICD-10-CM | POA: Insufficient documentation

## 2011-06-23 DIAGNOSIS — E7800 Pure hypercholesterolemia, unspecified: Secondary | ICD-10-CM | POA: Insufficient documentation

## 2011-06-23 HISTORY — DX: Pure hypercholesterolemia, unspecified: E78.00

## 2011-06-23 HISTORY — DX: Bipolar disorder, unspecified: F31.9

## 2011-06-23 LAB — COMPREHENSIVE METABOLIC PANEL
AST: 22 U/L (ref 0–37)
Albumin: 4.1 g/dL (ref 3.5–5.2)
Calcium: 10.4 mg/dL (ref 8.4–10.5)
Chloride: 99 mEq/L (ref 96–112)
Creatinine, Ser: 0.9 mg/dL (ref 0.50–1.35)
Total Protein: 7.7 g/dL (ref 6.0–8.3)

## 2011-06-23 LAB — CBC
MCH: 26.6 pg (ref 26.0–34.0)
MCHC: 33.8 g/dL (ref 30.0–36.0)
MCV: 78.5 fL (ref 78.0–100.0)
Platelets: 377 10*3/uL (ref 150–400)
RDW: 13.6 % (ref 11.5–15.5)
WBC: 12.7 10*3/uL — ABNORMAL HIGH (ref 4.0–10.5)

## 2011-06-23 LAB — URINALYSIS, ROUTINE W REFLEX MICROSCOPIC
Hgb urine dipstick: NEGATIVE
Ketones, ur: 15 mg/dL — AB
Protein, ur: NEGATIVE mg/dL
Urobilinogen, UA: 1 mg/dL (ref 0.0–1.0)

## 2011-06-23 LAB — LIPASE, BLOOD: Lipase: 24 U/L (ref 11–59)

## 2011-06-23 MED ORDER — HYDROCODONE-ACETAMINOPHEN 5-325 MG PO TABS
1.0000 | ORAL_TABLET | Freq: Once | ORAL | Status: AC
  Administered 2011-06-23: 1 via ORAL
  Filled 2011-06-23: qty 1

## 2011-06-23 NOTE — ED Notes (Signed)
C/o left side pain, epigastric pains that began suddenly approx ago after urinating. Dark in color. LBM today-denies complaints. Pain is described as burning/sharp/throbbing. C/o nausea.

## 2011-06-23 NOTE — ED Provider Notes (Signed)
History     Chief Complaint  Patient presents with  . Abdominal Pain   Patient is a 25 y.o. male presenting with abdominal pain.  Abdominal Pain The primary symptoms of the illness include abdominal pain and dysuria. The primary symptoms of the illness do not include fever, fatigue, shortness of breath, nausea, vomiting, diarrhea, hematemesis or hematochezia. The current episode started 1 to 2 hours ago. The onset of the illness was sudden.  The abdominal pain radiates to the left flank. The abdominal pain is relieved by nothing. Exacerbated by: nothing.  Associated with: urinating. Symptoms associated with the illness do not include chills, anorexia or back pain.   No h/o kidney stones, no trauma and no testicle pain, GU rash or discharge Past Medical History  Diagnosis Date  . Bipolar 1 disorder   . High cholesterol   . Schizophrenia     Past Surgical History  Procedure Date  . Pilonidal cyst excision     History reviewed. No pertinent family history.  History  Substance Use Topics  . Smoking status: Never Smoker   . Smokeless tobacco: Not on file  . Alcohol Use: No      Review of Systems  Constitutional: Negative for fever, chills and fatigue.  HENT: Negative for neck pain and neck stiffness.   Eyes: Negative for pain.  Respiratory: Negative for shortness of breath.   Cardiovascular: Negative for chest pain.  Gastrointestinal: Positive for abdominal pain. Negative for nausea, vomiting, diarrhea, hematochezia, anorexia and hematemesis.  Genitourinary: Positive for dysuria.  Musculoskeletal: Negative for back pain.  Skin: Negative for rash.  Neurological: Negative for headaches.  All other systems reviewed and are negative.    Physical Exam  BP 126/80  Pulse 85  Temp(Src) 98.5 F (36.9 C) (Oral)  Resp 18  SpO2 100%  Physical Exam  Constitutional: He is oriented to person, place, and time. He appears well-developed and well-nourished.  HENT:  Head:  Normocephalic and atraumatic.  Eyes: Conjunctivae and EOM are normal. Pupils are equal, round, and reactive to light.  Neck: Trachea normal. Neck supple. No thyromegaly present.  Cardiovascular: Normal rate, regular rhythm, S1 normal, S2 normal and normal pulses.     No systolic murmur is present   No diastolic murmur is present  Pulses:      Radial pulses are 2+ on the right side, and 2+ on the left side.  Pulmonary/Chest: Effort normal and breath sounds normal. He has no wheezes. He has no rhonchi. He has no rales. He exhibits no tenderness.  Abdominal: Soft. Normal appearance and bowel sounds are normal. There is no tenderness. There is no CVA tenderness and negative Murphy's sign.       loclaizes pan to L flank no reproducible tebnderness, no peritonitis  Musculoskeletal:       BLE:s Calves nontender, no cords or erythema, negative Homans sign  Neurological: He is alert and oriented to person, place, and time. He has normal strength. No cranial nerve deficit or sensory deficit. GCS eye subscore is 4. GCS verbal subscore is 5. GCS motor subscore is 6.  Skin: Skin is warm and dry. No rash noted. He is not diaphoretic.  Psychiatric: His speech is normal.       Cooperative and appropriate    ED Course  Procedures  MDM No peritonitis serial exams, CT scan to evalutae L flank pain,. Labs obtained and reviewed. No stone or acute process identified. Plan close PCp follow up at [facility] with Dr  Haines.       Sunnie Nielsen, MD 06/24/11 0100

## 2011-06-26 ENCOUNTER — Emergency Department (HOSPITAL_BASED_OUTPATIENT_CLINIC_OR_DEPARTMENT_OTHER)
Admission: EM | Admit: 2011-06-26 | Discharge: 2011-06-26 | Disposition: A | Discharge status: Home / Self Care | Payer: Medicaid Other | Attending: Emergency Medicine | Admitting: Emergency Medicine

## 2011-06-26 ENCOUNTER — Encounter (HOSPITAL_BASED_OUTPATIENT_CLINIC_OR_DEPARTMENT_OTHER): Payer: Self-pay | Admitting: *Deleted

## 2011-06-26 DIAGNOSIS — L02219 Cutaneous abscess of trunk, unspecified: Secondary | ICD-10-CM | POA: Insufficient documentation

## 2011-06-26 DIAGNOSIS — L03319 Cellulitis of trunk, unspecified: Secondary | ICD-10-CM | POA: Insufficient documentation

## 2011-06-26 DIAGNOSIS — E7800 Pure hypercholesterolemia, unspecified: Secondary | ICD-10-CM | POA: Insufficient documentation

## 2011-06-26 DIAGNOSIS — L0390 Cellulitis, unspecified: Secondary | ICD-10-CM

## 2011-06-26 DIAGNOSIS — F319 Bipolar disorder, unspecified: Secondary | ICD-10-CM | POA: Insufficient documentation

## 2011-06-26 MED ORDER — DOXYCYCLINE HYCLATE 100 MG PO CAPS: 100.0000 mg | ORAL_CAPSULE | Freq: Two times a day (BID) | ORAL | Status: AC

## 2011-06-26 NOTE — ED Provider Notes (Signed)
History    the patient presents with a chief complaint of the skin redness, tenderness, and induration at the right suprapubic area. On evaluation he has a very small apparent ingrown hair with a mild degree of the erythema and induration surrounding it. There is no apparent abscess or palpable fluctuance to suggest a drainable abscess or collection of purulent fluid.  Chief Complaint  Patient presents with  . Abscess   Patient is a 25 y.o. male presenting with abscess. The history is provided by the patient.  Abscess  This is a new problem. Episode onset: 2 days ago. The onset was gradual. The problem occurs continuously. The problem has been gradually worsening. The abscess is present on the groin. The problem is mild. The abscess is characterized by redness and painfulness. Pertinent negatives include no fever. He has received no recent medical care.    Past Medical History  Diagnosis Date  . Bipolar 1 disorder   . High cholesterol   . Schizophrenia   . Bipolar 1 disorder     Past Surgical History  Procedure Date  . Pilonidal cyst excision     History reviewed. No pertinent family history.  History  Substance Use Topics  . Smoking status: Never Smoker   . Smokeless tobacco: Not on file  . Alcohol Use: No      Review of Systems  Constitutional: Negative for fever and fatigue.  HENT: Negative.   Eyes: Negative.   Respiratory: Negative.   Cardiovascular: Negative.   Musculoskeletal: Negative.   Skin:       Otherwise negative  Hematological: Negative for adenopathy.    Physical Exam  BP 130/83  Temp(Src) 98.5 F (36.9 C) (Oral)  Resp 18  Wt 300 lb (136.079 kg)  SpO2 100%  Physical Exam  Nursing note and vitals reviewed. Constitutional: He is oriented to person, place, and time. He appears well-developed and well-nourished. No distress.  HENT:  Head: Normocephalic and atraumatic.  Eyes: Pupils are equal, round, and reactive to light.  Neck: Normal range of  motion. Neck supple.  Cardiovascular: Normal rate and regular rhythm.  Exam reveals no gallop and no friction rub.   No murmur heard. Pulmonary/Chest: Effort normal and breath sounds normal. No respiratory distress. He has no wheezes. He has no rales.  Abdominal: Soft. Bowel sounds are normal. There is no tenderness. There is no rebound and no guarding.  Musculoskeletal: Normal range of motion. He exhibits no edema and no tenderness.  Neurological: He is alert and oriented to person, place, and time.  Skin: Skin is warm and dry. Lesion noted. No rash noted. There is erythema.     Psychiatric: He has a normal mood and affect.    ED Course  Procedures  MDM Cellulitis vs. Folliculitis  I will treat the patient for cellulitis.      Felisa Bonier, MD 06/26/11 [phone]

## 2011-06-26 NOTE — ED Notes (Signed)
Pt c/o abscess to abd x 2 days 

## 2011-07-26 ENCOUNTER — Encounter (HOSPITAL_BASED_OUTPATIENT_CLINIC_OR_DEPARTMENT_OTHER): Payer: Self-pay | Admitting: Emergency Medicine

## 2011-07-26 ENCOUNTER — Emergency Department (HOSPITAL_BASED_OUTPATIENT_CLINIC_OR_DEPARTMENT_OTHER)
Admission: EM | Admit: 2011-07-26 | Discharge: 2011-07-26 | Disposition: A | Discharge status: Home / Self Care | Payer: Medicaid Other | Attending: Emergency Medicine | Admitting: Emergency Medicine

## 2011-07-26 ENCOUNTER — Emergency Department (INDEPENDENT_AMBULATORY_CARE_PROVIDER_SITE_OTHER): Payer: Medicaid Other

## 2011-07-26 DIAGNOSIS — R51 Headache: Secondary | ICD-10-CM

## 2011-07-26 DIAGNOSIS — Y844 Aspiration of fluid as the cause of abnormal reaction of the patient, or of later complication, without mention of misadventure at the time of the procedure: Secondary | ICD-10-CM | POA: Insufficient documentation

## 2011-07-26 DIAGNOSIS — F319 Bipolar disorder, unspecified: Secondary | ICD-10-CM | POA: Insufficient documentation

## 2011-07-26 DIAGNOSIS — M549 Dorsalgia, unspecified: Secondary | ICD-10-CM | POA: Insufficient documentation

## 2011-07-26 DIAGNOSIS — G971 Other reaction to spinal and lumbar puncture: Secondary | ICD-10-CM | POA: Insufficient documentation

## 2011-07-26 DIAGNOSIS — E7800 Pure hypercholesterolemia, unspecified: Secondary | ICD-10-CM | POA: Insufficient documentation

## 2011-07-26 LAB — BASIC METABOLIC PANEL
BUN: 12 mg/dL (ref 6–23)
CO2: 27 mEq/L (ref 19–32)
Chloride: 102 mEq/L (ref 96–112)
Glucose, Bld: 114 mg/dL — ABNORMAL HIGH (ref 70–99)
Potassium: 3.8 mEq/L (ref 3.5–5.1)
Sodium: 139 mEq/L (ref 135–145)

## 2011-07-26 LAB — CBC
HCT: 41.1 % (ref 39.0–52.0)
Hemoglobin: 13.9 g/dL (ref 13.0–17.0)
MCH: 26.6 pg (ref 26.0–34.0)
MCHC: 33.8 g/dL (ref 30.0–36.0)
MCV: 78.6 fL (ref 78.0–100.0)
Platelets: 421 10*3/uL — ABNORMAL HIGH (ref 150–400)
RBC: 5.23 MIL/uL (ref 4.22–5.81)
RDW: 14 % (ref 11.5–15.5)
WBC: 12.5 10*3/uL — ABNORMAL HIGH (ref 4.0–10.5)

## 2011-07-26 MED ORDER — HYDROCODONE-ACETAMINOPHEN 5-325 MG PO TABS: 2.0000 | ORAL_TABLET | ORAL | Status: AC | PRN

## 2011-07-26 MED ORDER — SODIUM CHLORIDE 0.9 % IV BOLUS (SEPSIS)
1000.0000 mL | Freq: Once | INTRAVENOUS | Status: AC
  Administered 2011-07-26: 1000 mL via INTRAVENOUS

## 2011-07-26 MED ORDER — ONDANSETRON HCL 4 MG/2ML IJ SOLN
4.0000 mg | Freq: Once | INTRAMUSCULAR | Status: AC
  Administered 2011-07-26: 4 mg via INTRAVENOUS
  Filled 2011-07-26: qty 2

## 2011-07-26 MED ORDER — HYDROMORPHONE HCL 1 MG/ML IJ SOLN
1.0000 mg | Freq: Once | INTRAMUSCULAR | Status: AC
  Administered 2011-07-26: 1 mg via INTRAVENOUS
  Filled 2011-07-26: qty 1

## 2011-07-26 NOTE — ED Provider Notes (Signed)
History     CSN: [id number] Arrival date & time: 07/26/2011  1:13 PM  Chief Complaint  Patient presents with  . Headache  . Back Pain   Patient is a 25 y.o. male presenting with back pain.  Back Pain  This is a new problem. The current episode started 2 days ago. The problem occurs constantly. The problem has not changed since onset.Associated with: LP  The pain is present in the lumbar spine. The quality of the pain is described as stabbing. The pain does not radiate. The pain is at a severity of 10/10. The pain is severe. The symptoms are aggravated by bending, twisting and certain positions. The pain is worse during the day. Stiffness is present in the morning. Associated symptoms include headaches and paresthesias. Pertinent negatives include no chest pain, no fever, no numbness, no abdominal pain, no abdominal swelling, no bowel incontinence, no perianal numbness, no bladder incontinence, no dysuria, no pelvic pain, no leg pain, no paresis, no tingling and no weakness.  Pt had an LP for evaluation on intercranial pressure.  Pt reports he has pain in his back and a headache.  Pt complains of feeling short of breath.  Past Medical History  Diagnosis Date  . Bipolar 1 disorder   . High cholesterol   . Schizophrenia   . Bipolar 1 disorder     Past Surgical History  Procedure Date  . Pilonidal cyst excision     No family history on file.  History  Substance Use Topics  . Smoking status: Never Smoker   . Smokeless tobacco: Not on file  . Alcohol Use: No      Review of Systems  Constitutional: Negative for fever.  Cardiovascular: Negative for chest pain.  Gastrointestinal: Negative for abdominal pain and bowel incontinence.  Genitourinary: Negative for bladder incontinence, dysuria and pelvic pain.  Musculoskeletal: Positive for back pain.  Neurological: Positive for headaches and paresthesias. Negative for tingling, weakness and numbness.  All other systems reviewed and  are negative.    Physical Exam  BP 127/66  Pulse 69  Temp(Src) 97.7 F (36.5 C) (Oral)  Resp 20  SpO2 100%  Physical Exam  Nursing note and vitals reviewed. Constitutional: He is oriented to person, place, and time. He appears well-developed and well-nourished.  HENT:  Head: Normocephalic and atraumatic.  Eyes: Conjunctivae and EOM are normal. Pupils are equal, round, and reactive to light.  Neck: Normal range of motion. Neck supple.  Cardiovascular: Normal rate.   Pulmonary/Chest: Effort normal.  Abdominal: Soft.  Musculoskeletal: Normal range of motion.       Tender thoracic and lumbar spine  Neurological: He is alert and oriented to person, place, and time. He has normal reflexes.  Skin: Skin is warm.  Psychiatric: He has a normal mood and affect.    ED Course  Procedures  MDM  Results for orders placed during the hospital encounter of 07/26/11  BASIC METABOLIC PANEL      Component Value Range   Sodium 139  135 - 145 (mEq/L)   Potassium 3.8  3.5 - 5.1 (mEq/L)   Chloride 102  96 - 112 (mEq/L)   CO2 27  19 - 32 (mEq/L)   Glucose, Bld 114 (*) 70 - 99 (mg/dL)   BUN 12  6 - 23 (mg/dL)   Creatinine, Ser 4.09  0.50 - 1.35 (mg/dL)   Calcium 9.9  8.4 - 81.1 (mg/dL)   GFR calc non Af Amer >60  >60 (mL/min)  GFR calc Af Amer >60  >60 (mL/min)  CBC      Component Value Range   WBC 12.5 (*) 4.0 - 10.5 (K/uL)   RBC 5.23  4.22 - 5.81 (MIL/uL)   Hemoglobin 13.9  13.0 - 17.0 (g/dL)   HCT 01.0  27.2 - 53.6 (%)   MCV 78.6  78.0 - 100.0 (fL)   MCH 26.6  26.0 - 34.0 (pg)   MCHC 33.8  30.0 - 36.0 (g/dL)   RDW 64.4  03.4 - 74.2 (%)   Platelets 421 (*) 150 - 400 (K/uL)   Dg Chest 2 View  07/26/2011  *RADIOLOGY REPORT*  Clinical Data: Headache and back pain.  CHEST - 2 VIEW  Comparison: PA and lateral chest 05/14/2011.  Findings: Lungs are clear.  Heart size is normal.  No pneumothorax or pleural effusion.  No bony abnormality.  IMPRESSION: Normal chest.  Original Report  Authenticated By: Bernadene Bell. Maricela Curet, M.D.    Pt given fluids pain medication,  Labs and chest xray are normal.  Pt advised to continue his medications and follow up with his MD for recheck. Medical screening examination/treatment/procedure(s) were performed by non-physician practitioner and as supervising physician I was immediately available for consultation/collaboration. Langston Masker, [place] 07/29/11 1652  Suzi Roots, MD 08/01/11 [phone]

## 2011-07-26 NOTE — ED Notes (Signed)
Pt reports HA & back after spinal tap Fri

## 2011-07-26 NOTE — ED Notes (Signed)
Water given per pt request.

## 2011-08-28 LAB — URINALYSIS, ROUTINE W REFLEX MICROSCOPIC
Nitrite: NEGATIVE
Protein, ur: NEGATIVE
Urobilinogen, UA: 1
pH: 6.5

## 2011-08-28 LAB — URINE CULTURE

## 2011-09-01 LAB — COMPREHENSIVE METABOLIC PANEL
ALT: 14
ALT: 19
ALT: 20
AST: 20
AST: 25
AST: 36
AST: 14
Albumin: 3.2 — ABNORMAL LOW
Albumin: 4.3
Alkaline Phosphatase: 57
BUN: 11
CO2: 26
CO2: 28
CO2: 29
Calcium: 10.3
Calcium: 8.3 — ABNORMAL LOW
Calcium: 8.6
Calcium: 9.9
Calcium: 9.1
Calcium: 9.3
Chloride: 101
Chloride: 107
Creatinine, Ser: 0.84
Creatinine, Ser: 0.9
Creatinine, Ser: 1.08
GFR calc Af Amer: >60
GFR calc Af Amer: >60
GFR calc Af Amer: >60
GFR calc Af Amer: >60
GFR calc non Af Amer: >60
GFR calc non Af Amer: >60
GFR calc non Af Amer: >60
Glucose, Bld: 116 — ABNORMAL HIGH
Glucose, Bld: 158 — ABNORMAL HIGH
Glucose, Bld: 110 — ABNORMAL HIGH
Glucose, Bld: 121 — ABNORMAL HIGH
Potassium: 4.5
Potassium: 3.9
Sodium: 137
Sodium: 140
Sodium: 139
Sodium: 141
Total Bilirubin: 0.7
Total Protein: 5.9 — ABNORMAL LOW
Total Protein: 7.4
Total Protein: 6.7

## 2011-09-01 LAB — ETHANOL: Alcohol, Ethyl (B): <10

## 2011-09-01 LAB — VALPROIC ACID LEVEL
Valproic Acid Lvl: 44.4 — ABNORMAL LOW
Valproic Acid Lvl: 56.5

## 2011-09-01 LAB — CBC
HCT: 41.4
HCT: 39
Hemoglobin: 13.8
Hemoglobin: 12.7 — ABNORMAL LOW
Hemoglobin: 13.3
Hemoglobin: 12.1 — ABNORMAL LOW
MCHC: 32.4
MCHC: 33
MCHC: 34.1
MCV: 83.6
MCV: 82.9
Platelets: 314
Platelets: 314
RBC: 4.7
RBC: 4.29
RDW: 12.9
RDW: 13.3
RDW: 14.5
WBC: 8.5
WBC: 10.1
WBC: 5.8

## 2011-09-01 LAB — DIFFERENTIAL
Basophils Absolute: 0.2 — ABNORMAL HIGH
Eosinophils Absolute: 0.1
Eosinophils Absolute: 0.1
Eosinophils Absolute: 0.1
Eosinophils Relative: 1
Eosinophils Relative: 1
Eosinophils Relative: 1
Lymphocytes Relative: 30
Lymphs Abs: 2.1
Lymphs Abs: 2.5
Lymphs Abs: 3.1
Monocytes Absolute: 0.8
Monocytes Absolute: 1.6 — ABNORMAL HIGH
Monocytes Relative: 16 — ABNORMAL HIGH
Neutrophils Relative %: 58
Neutrophils Relative %: 61

## 2011-09-01 LAB — BASIC METABOLIC PANEL
BUN: 11
CO2: 29
Chloride: 104
GFR calc non Af Amer: >60
Glucose, Bld: 84
Potassium: 4.6
Sodium: 141

## 2011-09-01 LAB — ACETAMINOPHEN LEVEL
Acetaminophen (Tylenol), Serum: 37 — ABNORMAL HIGH
Acetaminophen (Tylenol), Serum: <10 — ABNORMAL LOW
Acetaminophen (Tylenol), Serum: <10 — ABNORMAL LOW

## 2011-09-01 LAB — POCT TOXICOLOGY PANEL

## 2011-09-01 LAB — URINALYSIS, ROUTINE W REFLEX MICROSCOPIC
Bilirubin Urine: NEGATIVE
Glucose, UA: NEGATIVE
Hgb urine dipstick: NEGATIVE
Ketones, ur: NEGATIVE
Specific Gravity, Urine: 1.023
pH: 6.5

## 2011-09-01 LAB — PROTIME-INR
INR: 0.9
INR: 1
Prothrombin Time: 12.2
Prothrombin Time: 15.3 — ABNORMAL HIGH
Prothrombin Time: 13.8

## 2011-09-01 LAB — MAGNESIUM: Magnesium: 1.9

## 2011-10-03 ENCOUNTER — Emergency Department (HOSPITAL_BASED_OUTPATIENT_CLINIC_OR_DEPARTMENT_OTHER)
Admission: EM | Admit: 2011-10-03 | Discharge: 2011-10-04 | Disposition: A | Discharge status: Home / Self Care | Payer: Medicaid Other | Attending: Emergency Medicine | Admitting: Emergency Medicine

## 2011-10-03 ENCOUNTER — Encounter (HOSPITAL_BASED_OUTPATIENT_CLINIC_OR_DEPARTMENT_OTHER): Payer: Self-pay | Admitting: *Deleted

## 2011-10-03 DIAGNOSIS — L989 Disorder of the skin and subcutaneous tissue, unspecified: Secondary | ICD-10-CM | POA: Insufficient documentation

## 2011-10-03 DIAGNOSIS — F319 Bipolar disorder, unspecified: Secondary | ICD-10-CM | POA: Insufficient documentation

## 2011-10-03 DIAGNOSIS — L0291 Cutaneous abscess, unspecified: Secondary | ICD-10-CM

## 2011-10-03 NOTE — ED Notes (Signed)
Pt report he noticed lump on left buttock today

## 2011-10-03 NOTE — ED Provider Notes (Signed)
Scribed for Forbes Cellar, MD, the patient was seen in room MH07/MH07. This chart was scribed by [employer]. The patient's care started at 23:54  CSN: [id number] Arrival date & time: 10/03/2011 11:47 PM   First MD Initiated Contact with Patient 10/03/11 2354      Chief Complaint  Patient presents with  . Mass   HPI [name] is a 25 y.o. male who presents to the Emergency Department complaining of Mass. Patient states that he noticed a lump on his left leg today. Patient denies any fevers or chills. H/o abscess to same area in past. No h/o DM   Past Medical History  Diagnosis Date  . Bipolar 1 disorder     Past Surgical History  Procedure Date  . Pilonidal cyst excision     No family history on file.  History  Substance Use Topics  . Smoking status: Never Smoker   . Smokeless tobacco: Never Used  . Alcohol Use: No      Review of Systems  Constitutional: Negative for fever and chills.       10 Systems reviewed and are negative for acute change except as noted in the HPI.  HENT: Negative for congestion.   Eyes: Negative for discharge and redness.  Respiratory: Negative for cough, chest tightness and shortness of breath.   Cardiovascular: Negative for chest pain.  Gastrointestinal: Negative for nausea, vomiting and abdominal pain.  Musculoskeletal: Negative for back pain.  Skin: Negative for rash.  Neurological: Negative for syncope, numbness and headaches.  Psychiatric/Behavioral:       No behavior change.  All other systems reviewed and are negative.    Allergies  Aspirin; Ibuprofen; Metformin and related; and Ritalin  Home Medications   Current Outpatient Rx  Name Route Sig Dispense Refill  . ACETAMINOPHEN 500 MG PO TABS Oral Take 1,000 mg by mouth every 6 (six) hours as needed. For pain     . SIMVASTATIN 10 MG PO TABS Oral Take 10 mg by mouth at bedtime.        BP 120/70  Pulse 88  Temp(Src) 98.1 F (36.7 C) (Oral)  Resp 20  Ht 6\' 3"   (1.905 m)  Wt 312 lb (141.522 kg)  BMI 39.00 kg/m2  SpO2 100%  Physical Exam  Nursing note and vitals reviewed. Constitutional: He is oriented to person, place, and time. He appears well-developed and well-nourished.  HENT:  Head: Normocephalic and atraumatic.  Mouth/Throat: Oropharynx is clear and moist.  Eyes: Conjunctivae and EOM are normal. Right eye exhibits no discharge. Left eye exhibits no discharge.  Neck: Normal range of motion. No tracheal deviation present.  Cardiovascular: Normal rate and normal heart sounds.   No murmur heard. Pulmonary/Chest: Breath sounds normal. No respiratory distress. He has no wheezes. He has no rales.  Abdominal: Soft. Bowel sounds are normal. He exhibits no distension. There is no tenderness. There is no rebound.  Musculoskeletal: Normal range of motion. He exhibits no tenderness.  Neurological: He is alert and oriented to person, place, and time. No cranial nerve deficit.  Skin: Skin is warm and dry. No rash noted.       1cmx1cm area of induration on the left posterior medial upper leg. Draining small amt of purulent discharge, more with palpation. Mild fluctuance and erythema noted.   Psychiatric: He has a normal mood and affect. His behavior is normal.    ED Course  Procedures  DIAGNOSTIC STUDIES: Oxygen Saturation is 100% on room air, normal by my  interpretation.    COORDINATION OF CARE: 23:56 - EDP examined patient at bedside. Small area of induration with draining and mild fluctuance noted by his left leg--draining. Does not need additional I&D. Patient instructed to use warm presses. No abx for now. Precautions for return.  Scribe Attestation I personally performed the services described in this documentation, which was scribed in my presence. The recorded information has been reviewed and considered.  Stefano Gaul, MD       Forbes Cellar, MD 10/04/11 [phone]

## 2011-11-27 ENCOUNTER — Encounter (HOSPITAL_BASED_OUTPATIENT_CLINIC_OR_DEPARTMENT_OTHER): Payer: Self-pay | Admitting: Emergency Medicine

## 2011-11-27 DIAGNOSIS — L738 Other specified follicular disorders: Secondary | ICD-10-CM | POA: Insufficient documentation

## 2011-11-27 DIAGNOSIS — H9209 Otalgia, unspecified ear: Secondary | ICD-10-CM | POA: Insufficient documentation

## 2011-11-27 DIAGNOSIS — J069 Acute upper respiratory infection, unspecified: Secondary | ICD-10-CM | POA: Insufficient documentation

## 2011-11-27 NOTE — ED Notes (Signed)
Pt c/o sore throat and right ear pain. Pt also c/o abscess in groin area.

## 2011-11-28 ENCOUNTER — Emergency Department (HOSPITAL_BASED_OUTPATIENT_CLINIC_OR_DEPARTMENT_OTHER)
Admission: EM | Admit: 2011-11-28 | Discharge: 2011-11-28 | Disposition: A | Discharge status: Home / Self Care | Payer: Medicaid Other | Attending: Emergency Medicine | Admitting: Emergency Medicine

## 2011-11-28 DIAGNOSIS — J029 Acute pharyngitis, unspecified: Secondary | ICD-10-CM

## 2011-11-28 DIAGNOSIS — L739 Follicular disorder, unspecified: Secondary | ICD-10-CM

## 2011-11-28 DIAGNOSIS — J069 Acute upper respiratory infection, unspecified: Secondary | ICD-10-CM

## 2011-11-28 LAB — STREP A DNA PROBE: Group A Strep Probe: NEGATIVE

## 2011-11-28 MED ORDER — ACETAMINOPHEN 325 MG PO TABS
650.0000 mg | ORAL_TABLET | Freq: Once | ORAL | Status: AC
  Administered 2011-11-28: 650 mg via ORAL
  Filled 2011-11-28: qty 2

## 2011-11-28 NOTE — ED Provider Notes (Signed)
History     CSN: [id number]  Arrival date & time 11/27/11  2342   First MD Initiated Contact with Patient 11/28/11 0132      Chief Complaint  Patient presents with  . Abscess  . Sore Throat  . Otalgia    (Consider location/radiation/quality/duration/timing/severity/associated sxs/prior treatment) HPI 25yoM since with multiple complaints. The patient states that for the past 2-3 days she's experienced sore throat which is worse and today. He is also complaining of "whooshing" sound in his right ear. He denies pain in his right ear. He denies fever, chills. He is having rhinorrhea nasal congestion. He denies body aches. He denies chest pain, shortness of breath. The patient does complain of possible abscess in the suprapubic area which has been present for approximately one day. There is no pus drainage. Denies sick contacts. Denies muffled voice, difficulty opening mouth, hoarseness.   ED Notes, ED Provider Notes from 11/27/11 0000 to 11/27/11 23:58:24       Bufford Buttner, RN 11/27/2011 23:57      Pt c/o sore throat and right ear pain. Pt also c/o abscess in groin area.      Past Medical History  Diagnosis Date  . Bipolar 1 disorder     Past Surgical History  Procedure Date  . Pilonidal cyst excision     No family history on file.  History  Substance Use Topics  . Smoking status: Never Smoker   . Smokeless tobacco: Never Used  . Alcohol Use: No      Review of Systems  All other systems reviewed and are negative.   except as noted HPI  Allergies  Aspirin; Ibuprofen; Metformin and related; and Ritalin  Home Medications   Current Outpatient Rx  Name Route Sig Dispense Refill  . ACETAMINOPHEN 500 MG PO TABS Oral Take 1,000 mg by mouth every 6 (six) hours as needed. For pain     . SIMVASTATIN 10 MG PO TABS Oral Take 10 mg by mouth at bedtime.        BP 123/83  Pulse 79  Temp(Src) 98.2 F (36.8 C) (Oral)  Resp 18  SpO2 97%  Physical Exam    Nursing note and vitals reviewed. Constitutional: He is oriented to person, place, and time. He appears well-developed and well-nourished. No distress.  HENT:  Head: Atraumatic.       TM clear b/l  No trismus or muffled voice Posterior OP mildly erythematous 1+ tonsillar swelling, no exudates Uvula midline  Eyes: Pupils are equal, round, and reactive to light.       Min b/l conj injection  Neck: Normal range of motion. Neck supple.  Cardiovascular: Normal rate, regular rhythm, normal heart sounds and intact distal pulses.  Exam reveals no gallop and no friction rub.   No murmur heard. Pulmonary/Chest: Effort normal. No respiratory distress. He has no wheezes. He has no rales.  Abdominal: Soft. Bowel sounds are normal. There is no tenderness. There is no rebound and no guarding.  Musculoskeletal: Normal range of motion. He exhibits no edema and no tenderness.  Lymphadenopathy:    He has no cervical adenopathy.  Neurological: He is alert and oriented to person, place, and time.  Skin: Skin is warm and dry.       Suprapubic area with 1cm area of erythema and swelling at base of hair follicle. No induration or fluctuance  Psychiatric: He has a normal mood and affect.    ED Course  Procedures (including critical care time)  Labs Reviewed  RAPID STREP SCREEN  STREP A DNA PROBE   No results found.   1. Pharyngitis   2. Upper respiratory infection   3. Folliculitis     MDM  Presents with symptoms consistent with upper respiratory infection and pharyngitis. His rapid strep was negative here- Has been sent for culture. He also has a folliculitis. The patient appears well he will go home with Tylenol as needed, salt water gargles. Primary care followup as needed.        Forbes Cellar, MD 11/28/11 [phone]

## 2011-12-04 DIAGNOSIS — H4710 Unspecified papilledema: Secondary | ICD-10-CM | POA: Insufficient documentation

## 2011-12-30 ENCOUNTER — Other Ambulatory Visit: Payer: Self-pay

## 2011-12-30 ENCOUNTER — Emergency Department (HOSPITAL_BASED_OUTPATIENT_CLINIC_OR_DEPARTMENT_OTHER)
Admission: EM | Admit: 2011-12-30 | Discharge: 2011-12-30 | Disposition: A | Discharge status: Home / Self Care | Payer: Medicaid Other | Attending: Emergency Medicine | Admitting: Emergency Medicine

## 2011-12-30 ENCOUNTER — Emergency Department (INDEPENDENT_AMBULATORY_CARE_PROVIDER_SITE_OTHER): Payer: Medicaid Other

## 2011-12-30 ENCOUNTER — Encounter (HOSPITAL_BASED_OUTPATIENT_CLINIC_OR_DEPARTMENT_OTHER): Payer: Self-pay | Admitting: *Deleted

## 2011-12-30 DIAGNOSIS — E669 Obesity, unspecified: Secondary | ICD-10-CM | POA: Insufficient documentation

## 2011-12-30 DIAGNOSIS — F319 Bipolar disorder, unspecified: Secondary | ICD-10-CM | POA: Insufficient documentation

## 2011-12-30 DIAGNOSIS — R51 Headache: Secondary | ICD-10-CM

## 2011-12-30 DIAGNOSIS — R079 Chest pain, unspecified: Secondary | ICD-10-CM | POA: Insufficient documentation

## 2011-12-30 LAB — BASIC METABOLIC PANEL
BUN: 11 mg/dL (ref 6–23)
Creatinine, Ser: 1 mg/dL (ref 0.50–1.35)
GFR calc Af Amer: >90 mL/min (ref >90)
GFR calc non Af Amer: >90 mL/min (ref >90)
Potassium: 4.3 mEq/L (ref 3.5–5.1)

## 2011-12-30 MED ORDER — ACETAMINOPHEN 500 MG PO TABS
1000.0000 mg | ORAL_TABLET | Freq: Once | ORAL | Status: AC
  Administered 2011-12-30: 1000 mg via ORAL
  Filled 2011-12-30: qty 2

## 2011-12-30 NOTE — ED Notes (Signed)
Patient states he woke up this morning with headache and shortness of breath.  States he was having problems catching  his breath and then developed left sharp stabbing chest pain.  States he gets sob with exertion and talking.  Patient is resting in bed in NAD, respirations 16, O2 Sat 100 on room air.

## 2011-12-30 NOTE — ED Provider Notes (Signed)
History     CSN: [id number]  Arrival date & time 12/30/11  1018   First MD Initiated Contact with Patient 12/30/11 1059      Chief Complaint  Patient presents with  . Chest Pain    shortness of breath    (Consider location/radiation/quality/duration/timing/severity/associated sxs/prior treatment) HPI The patient presents with headache and shortness of breath.  He notes that he woke with these complaints, approximately 3 hours ago.  Since onset he said diffuse, throbbing headache, and mild dyspnea.  He associates left-sided sharp chest pain with the dyspnea.  No relief with OTC medications.  Patient has not exerted himself or done any different behavior, and cannot offer any clear exacerbating factors.  He denies any fevers, chills, vomiting, diarrhea, discoordination, disorientation. He notes that he has been in his usual state of health, and saw his physician yesterday for a checkup. Past Medical History  Diagnosis Date  . Bipolar 1 disorder     Past Surgical History  Procedure Date  . Pilonidal cyst excision   . Ganglion cyst excision     No family history on file.  History  Substance Use Topics  . Smoking status: Never Smoker   . Smokeless tobacco: Never Used  . Alcohol Use: No      Review of Systems  Constitutional:       Per HPI, otherwise negative  HENT:       Per HPI, otherwise negative  Eyes: Negative.   Respiratory:       Per HPI, otherwise negative  Cardiovascular:       Per HPI, otherwise negative  Gastrointestinal: Negative for vomiting.  Genitourinary: Negative.   Musculoskeletal:       Per HPI, otherwise negative  Skin: Negative.   Neurological: Negative for syncope.    Allergies  Aspirin; Ibuprofen; Metformin and related; and Ritalin  Home Medications   Current Outpatient Rx  Name Route Sig Dispense Refill  . ACETAMINOPHEN 500 MG PO TABS Oral Take 1,000 mg by mouth every 6 (six) hours as needed. For pain     . SIMVASTATIN 10 MG PO TABS  Oral Take 10 mg by mouth at bedtime.        BP 131/81  Pulse 73  Temp(Src) 97.5 F (36.4 C) (Oral)  Resp 16  Ht 6\' 3"  (1.905 m)  Wt 306 lb (138.801 kg)  BMI 38.25 kg/m2  SpO2 100%  Physical Exam  Nursing note and vitals reviewed. Constitutional: He is oriented to person, place, and time. He appears well-developed. No distress.       Obese young male, sitting in no distress  HENT:  Head: Normocephalic and atraumatic.  Eyes: Conjunctivae and EOM are normal.  Cardiovascular: Normal rate and regular rhythm.   Pulmonary/Chest: Effort normal. No stridor. No respiratory distress. He has no wheezes. He has no rales. He exhibits no tenderness.  Abdominal: Soft. He exhibits no distension. There is no tenderness.  Musculoskeletal: He exhibits no edema.  Neurological: He is alert and oriented to person, place, and time.  Skin: Skin is warm and dry.  Psychiatric: He has a normal mood and affect.    ED Course  Procedures (including critical care time)   Labs Reviewed  BASIC METABOLIC PANEL   No results found.   No diagnosis found.  Cardiac monitor 65, sinus rhythm, normal Pulse oximetry 100% on room air, normal   Date: 12/30/2011  Rate: 67  Rhythm: normal sinus rhythm  QRS Axis: normal  Intervals: normal  ST/T Wave abnormalities: nonspecific ST changes  Conduction Disutrbances:none  Narrative Interpretation:   Old EKG Reviewed: unchanged  ABNORMAL ECG  X-ray reviewed by me  MDM  This young male presents in no distress with chest pain.  On exam the patient is in no distress, has unremarkable vital signs, and unchanged ECG, and a chest x-ray that does not demonstrate acute pathology.  The patient's unremarkable labs for further reassurance for this being an episode of acute on chronic complaints.  He was discharged in stable condition to follow up with his primary care physician.       Gerhard Munch, MD 12/30/11 1308

## 2012-07-17 ENCOUNTER — Emergency Department (HOSPITAL_BASED_OUTPATIENT_CLINIC_OR_DEPARTMENT_OTHER)
Admission: EM | Admit: 2012-07-17 | Discharge: 2012-07-17 | Disposition: A | Discharge status: Home / Self Care | Payer: Medicaid Other | Attending: Emergency Medicine | Admitting: Emergency Medicine

## 2012-07-17 ENCOUNTER — Emergency Department (HOSPITAL_BASED_OUTPATIENT_CLINIC_OR_DEPARTMENT_OTHER): Payer: Medicaid Other

## 2012-07-17 ENCOUNTER — Encounter (HOSPITAL_BASED_OUTPATIENT_CLINIC_OR_DEPARTMENT_OTHER): Payer: Self-pay | Admitting: *Deleted

## 2012-07-17 DIAGNOSIS — R197 Diarrhea, unspecified: Secondary | ICD-10-CM | POA: Insufficient documentation

## 2012-07-17 DIAGNOSIS — F319 Bipolar disorder, unspecified: Secondary | ICD-10-CM | POA: Insufficient documentation

## 2012-07-17 DIAGNOSIS — R109 Unspecified abdominal pain: Secondary | ICD-10-CM | POA: Insufficient documentation

## 2012-07-17 LAB — URINALYSIS, ROUTINE W REFLEX MICROSCOPIC
Leukocytes, UA: NEGATIVE
Nitrite: NEGATIVE
Specific Gravity, Urine: 1.038 — ABNORMAL HIGH (ref 1.005–1.030)
pH: 5.5 (ref 5.0–8.0)

## 2012-07-17 LAB — CBC WITH DIFFERENTIAL/PLATELET
Basophils Absolute: 0 10*3/uL (ref 0.0–0.1)
Basophils Relative: 0 % (ref 0–1)
Hemoglobin: 13.4 g/dL (ref 13.0–17.0)
Lymphocytes Relative: 5 % — ABNORMAL LOW (ref 12–46)
MCHC: 34.4 g/dL (ref 30.0–36.0)
Neutro Abs: 14.1 10*3/uL — ABNORMAL HIGH (ref 1.7–7.7)
Neutrophils Relative %: 86 % — ABNORMAL HIGH (ref 43–77)
RDW: 13.4 % (ref 11.5–15.5)
WBC: 16.4 10*3/uL — ABNORMAL HIGH (ref 4.0–10.5)

## 2012-07-17 LAB — BASIC METABOLIC PANEL
Chloride: 100 mEq/L (ref 96–112)
GFR calc Af Amer: >90 mL/min (ref >90)
Potassium: 3.9 mEq/L (ref 3.5–5.1)

## 2012-07-17 LAB — URINE MICROSCOPIC-ADD ON

## 2012-07-17 MED ORDER — GI COCKTAIL ~~LOC~~
30.0000 mL | Freq: Once | ORAL | Status: AC
  Administered 2012-07-17: 30 mL via ORAL
  Filled 2012-07-17: qty 30

## 2012-07-17 MED ORDER — IOHEXOL 300 MG/ML  SOLN
100.0000 mL | Freq: Once | INTRAMUSCULAR | Status: AC | PRN
  Administered 2012-07-17: 100 mL via INTRAVENOUS

## 2012-07-17 MED ORDER — IOHEXOL 300 MG/ML  SOLN
40.0000 mL | Freq: Once | INTRAMUSCULAR | Status: AC | PRN
  Administered 2012-07-17: 40 mL via ORAL

## 2012-07-17 NOTE — ED Notes (Signed)
Pt reports sharp abd pain since yesterday morning- vomited x 5 "like water"- 4-5 BMs

## 2012-07-17 NOTE — ED Provider Notes (Signed)
History     CSN: [id number]  Arrival date & time 07/17/12  4098   First MD Initiated Contact with Patient 07/17/12 [phone]      Chief Complaint  Patient presents with  . Abdominal Pain    (Consider location/radiation/quality/duration/timing/severity/associated sxs/prior treatment) Patient is a 26 y.o. male presenting with abdominal pain.  Abdominal Pain The primary symptoms of the illness include abdominal pain and diarrhea. The primary symptoms of the illness do not include fever. The current episode started 2 days ago. The onset of the illness was gradual. The problem has not changed since onset. The abdominal pain began 2 days ago. The pain came on gradually. The abdominal pain has been unchanged since its onset. The abdominal pain is generalized. The abdominal pain does not radiate. The abdominal pain is relieved by nothing. Exacerbated by: nothing.  The diarrhea began 2 days ago. The diarrhea is semi-solid. The diarrhea occurs 2 to 4 times per day. Risk factors: unknown.  The patient has had a change in bowel habit. Symptoms associated with the illness do not include chills, anorexia or diaphoresis. Significant associated medical issues do not include PUD.    Past Medical History  Diagnosis Date  . Bipolar 1 disorder     Past Surgical History  Procedure Date  . Pilonidal cyst excision   . Ganglion cyst excision     No family history on file.  History  Substance Use Topics  . Smoking status: Never Smoker   . Smokeless tobacco: Never Used  . Alcohol Use: No      Review of Systems  Constitutional: Negative for fever, chills and diaphoresis.  Gastrointestinal: Positive for abdominal pain and diarrhea. Negative for anorexia.  All other systems reviewed and are negative.    Allergies  Aspirin; Ibuprofen; Metformin and related; and Ritalin  Home Medications   Current Outpatient Rx  Name Route Sig Dispense Refill  . ACETAMINOPHEN 500 MG PO TABS Oral Take 1,000 mg  by mouth every 6 (six) hours as needed. For pain     . SIMVASTATIN 10 MG PO TABS Oral Take 10 mg by mouth at bedtime.        BP 153/89  Pulse 93  Temp 98 F (36.7 C) (Oral)  Resp 20  SpO2 100%  Physical Exam  Constitutional: He is oriented to person, place, and time. He appears well-developed and well-nourished. No distress.  HENT:  Head: Normocephalic and atraumatic.  Mouth/Throat: Oropharynx is clear and moist.  Eyes: Conjunctivae are normal. Pupils are equal, round, and reactive to light.  Neck: Normal range of motion. Neck supple.  Cardiovascular: Normal rate and regular rhythm.   Pulmonary/Chest: Effort normal and breath sounds normal.  Abdominal: Soft. Bowel sounds are normal. There is generalized tenderness. There is no rebound and no guarding.  Musculoskeletal: Normal range of motion.  Neurological: He is alert and oriented to person, place, and time.  Skin: Skin is warm and dry.  Psychiatric: He has a normal mood and affect.    ED Course  Procedures (including critical care time)  Labs Reviewed  CBC WITH DIFFERENTIAL - Abnormal; Notable for the following:    WBC 16.4 (*)     Neutrophils Relative 86 (*)     Neutro Abs 14.1 (*)     Lymphocytes Relative 5 (*)     Monocytes Absolute 1.4 (*)     All other components within normal limits  BASIC METABOLIC PANEL - Abnormal; Notable for the following:    Glucose,  Bld 134 (*)     All other components within normal limits  URINALYSIS, ROUTINE W REFLEX MICROSCOPIC - Abnormal; Notable for the following:    Color, Urine AMBER (*)  BIOCHEMICALS MAY BE AFFECTED BY COLOR   Specific Gravity, Urine 1.038 (*)     Bilirubin Urine SMALL (*)     Ketones, ur 15 (*)     Protein, ur 30 (*)     All other components within normal limits  URINE MICROSCOPIC-ADD ON - Abnormal; Notable for the following:    Bacteria, UA FEW (*)     All other components within normal limits   Ct Abdomen Pelvis W Contrast  07/17/2012  *RADIOLOGY REPORT*   Clinical Data: Abdominal pain and vomiting.  CT ABDOMEN AND PELVIS WITH CONTRAST  Technique:  Multidetector CT imaging of the abdomen and pelvis was performed following the standard protocol during bolus administration of intravenous contrast.  Contrast: 40mL OMNIPAQUE IOHEXOL 300 MG/ML  SOLN, OMNIPAQUE IOHEXOL 300 MG/ML  SOLN  Comparison: 06/23/2011  Findings: The lung bases are clear.  Mild low attenuation change in the liver suggesting mild fatty infiltration.  No focal lesions.  The gallbladder, spleen, pancreas, adrenal glands, kidneys, abdominal aorta, and retroperitoneal lymph nodes are unremarkable.  Prominent visceral adipose tissues.  No free air or free fluid in the abdomen.  The stomach, small bowel, and colon are not abnormally distended.  Pelvis:  The bladder is incompletely distended.  There is suggestion of bladder wall thickening which could be due to infection or normal under distension.  Prostate gland is not enlarged.  No free or loculated pelvic fluid collections. Scattered right lower quadrant lymph nodes are not pathologically enlarged and may be reactive.  The appendix is normal.  No diverticulitis.  Normal alignment of the lumbar vertebrae.  IMPRESSION: Mild diffuse fatty infiltration in the liver.  The no acute process demonstrated in the abdomen or pelvis.  Original Report Authenticated By: Marlon Pel, M.D.     No diagnosis found.    MDM  Suspect gas painFollow up with your family doctor return for worsening symptoms.  Intractable vomiting, pain or fevers.  Patient verbalizes understanding and agrees to follow up        Biana Haggar Smitty Cords, MD 07/17/12 [phone]

## 2013-02-06 ENCOUNTER — Emergency Department (HOSPITAL_BASED_OUTPATIENT_CLINIC_OR_DEPARTMENT_OTHER)
Admission: EM | Admit: 2013-02-06 | Discharge: 2013-02-06 | Disposition: A | Discharge status: Home / Self Care | Payer: Medicaid Other | Attending: Emergency Medicine | Admitting: Emergency Medicine

## 2013-02-06 ENCOUNTER — Encounter (HOSPITAL_BASED_OUTPATIENT_CLINIC_OR_DEPARTMENT_OTHER): Payer: Self-pay | Admitting: Family Medicine

## 2013-02-06 ENCOUNTER — Other Ambulatory Visit: Payer: Self-pay

## 2013-02-06 DIAGNOSIS — M25519 Pain in unspecified shoulder: Secondary | ICD-10-CM | POA: Insufficient documentation

## 2013-02-06 DIAGNOSIS — Z8659 Personal history of other mental and behavioral disorders: Secondary | ICD-10-CM | POA: Insufficient documentation

## 2013-02-06 DIAGNOSIS — Z79899 Other long term (current) drug therapy: Secondary | ICD-10-CM | POA: Insufficient documentation

## 2013-02-06 DIAGNOSIS — M25512 Pain in left shoulder: Secondary | ICD-10-CM

## 2013-02-06 MED ORDER — ACETAMINOPHEN 500 MG PO TABS
1000.0000 mg | ORAL_TABLET | Freq: Once | ORAL | Status: AC
  Administered 2013-02-06: 1000 mg via ORAL
  Filled 2013-02-06: qty 2

## 2013-02-06 NOTE — ED Provider Notes (Signed)
History  This chart was scribed for Hurman Horn, MD by Ardeen Jourdain, ED Scribe. This patient was seen in room MH07/MH07 and the patient's care was started at 2115.  CSN: [id number]  Arrival date & time 02/06/13  1831   First MD Initiated Contact with Patient 02/06/13 2115      Chief Complaint  Patient presents with  . Shoulder Pain     The history is provided by the patient. No language interpreter was used.    [name] is a 27 y.o. male who presents to the Emergency Department complaining of gradual onset, constant gradually worsening left shoulder pain that began 6 hours ago. He states he had woken up from a nap when the pain began. He states the pain radiates across his chest, up his neck and down his arm. He states the pain is aggravated by palpation. He denies any weakness, numbness, severe HA, abdominal pain, nausea, emesis, neck pain and back pain as associated symptoms. He denies any change in speech, vision, swallow or understanding. He denies any trauma or injury to the area, He denies taking anything for the pain. He denies any IV or illicit drug usage.    Past Medical History  Diagnosis Date  . Bipolar 1 disorder     Past Surgical History  Procedure Laterality Date  . Pilonidal cyst excision    . Ganglion cyst excision      No family history on file.  History  Substance Use Topics  . Smoking status: Never Smoker   . Smokeless tobacco: Never Used  . Alcohol Use: No      Review of Systems  10 Systems reviewed and are negative for acute change except as noted in the HPI.   Allergies  Aspirin; Ibuprofen; Metformin and related; and Ritalin  Home Medications   Current Outpatient Rx  Name  Route  Sig  Dispense  Refill  . acetaminophen (TYLENOL) 500 MG tablet   Oral   Take 1,000 mg by mouth every 6 (six) hours as needed. For pain          . simvastatin (ZOCOR) 10 MG tablet   Oral   Take 10 mg by mouth at bedtime.             BP 122/61   Pulse 65  Temp(Src) 98 F (36.7 C) (Oral)  Resp 20  SpO2 98%  Physical Exam  Nursing note and vitals reviewed. Constitutional: He appears well-developed and well-nourished. No distress.  Awake, alert, nontoxic appearance.  HENT:  Head: Atraumatic.  Eyes: Right eye exhibits no discharge. Left eye exhibits no discharge.  Neck: Normal range of motion. Neck supple.  Cardiovascular: Regular rhythm and normal heart sounds.  Exam reveals no gallop and no friction rub.   No murmur heard. Pulmonary/Chest: Effort normal and breath sounds normal. No respiratory distress. He has no wheezes. He has no rales. He exhibits no tenderness.  Abdominal: Soft. There is no tenderness. There is no rebound.  Musculoskeletal: He exhibits no tenderness.  No cervical spine tenderness, no back tenderness, non-tender right arm and both legs. Left arm is non tender as are the hand, wrist, forearm and elbow. CR <2 sec in left hand. Left hand intact light touch and motor distribution of median, radial and ulnar nerve function. Diffusely tender over deltoid, trapezius, left paracervical and left upper chest. Negative drop test left shoulder   Neurological:  Mental status and motor strength appears baseline for patient and situation.  Skin: Skin  is warm and dry. No rash noted. He is not diaphoretic.  Psychiatric: He has a normal mood and affect. His behavior is normal.    ED Course  Procedures (including critical care time) ECG: Normal sinus rhythm, with sinus arrhythmia, ventricular rate 67, normal axis, normal intervals, no acute ischemic changes noted, no significant change noted compared with January 2013   DIAGNOSTIC STUDIES: Oxygen Saturation is 98% on room air, normal by my interpretation.    COORDINATION OF CARE:  9:26 PM: Patient / Family / Caregiver understand and agree with initial ED impression and plan with expectations set for ED visit.    Results for orders placed during the hospital encounter  of 02/06/13  TROPONIN I      Result Value Range   Troponin I <0.30  <0.30 ng/mL    No results found.   1. Left shoulder pain       MDM   I doubt any other EMC precluding discharge at this time including, but not necessarily limited to the following:ACS, PE.  I personally performed the services described in this documentation, which was scribed in my presence. The recorded information has been reviewed and is accurate.    Hurman Horn, MD 02/08/13 2256

## 2013-02-06 NOTE — ED Notes (Signed)
Pt c/o left shoulder, arm and pain shooting across chest and upper back since waking up at 2pm. Pt describes pain as burning.

## 2014-03-20 ENCOUNTER — Emergency Department (HOSPITAL_BASED_OUTPATIENT_CLINIC_OR_DEPARTMENT_OTHER)
Admission: EM | Admit: 2014-03-20 | Discharge: 2014-03-20 | Disposition: A | Discharge status: Home / Self Care | Payer: Medicaid Other | Attending: Emergency Medicine | Admitting: Emergency Medicine

## 2014-03-20 ENCOUNTER — Encounter (HOSPITAL_BASED_OUTPATIENT_CLINIC_OR_DEPARTMENT_OTHER): Payer: Self-pay | Admitting: Emergency Medicine

## 2014-03-20 DIAGNOSIS — R0989 Other specified symptoms and signs involving the circulatory and respiratory systems: Secondary | ICD-10-CM | POA: Insufficient documentation

## 2014-03-20 DIAGNOSIS — R42 Dizziness and giddiness: Secondary | ICD-10-CM | POA: Insufficient documentation

## 2014-03-20 DIAGNOSIS — E7800 Pure hypercholesterolemia, unspecified: Secondary | ICD-10-CM | POA: Insufficient documentation

## 2014-03-20 DIAGNOSIS — R0602 Shortness of breath: Secondary | ICD-10-CM | POA: Insufficient documentation

## 2014-03-20 DIAGNOSIS — J3489 Other specified disorders of nose and nasal sinuses: Secondary | ICD-10-CM | POA: Insufficient documentation

## 2014-03-20 DIAGNOSIS — R519 Headache, unspecified: Secondary | ICD-10-CM

## 2014-03-20 DIAGNOSIS — Z8659 Personal history of other mental and behavioral disorders: Secondary | ICD-10-CM | POA: Insufficient documentation

## 2014-03-20 DIAGNOSIS — R51 Headache: Secondary | ICD-10-CM | POA: Insufficient documentation

## 2014-03-20 DIAGNOSIS — R0609 Other forms of dyspnea: Secondary | ICD-10-CM | POA: Insufficient documentation

## 2014-03-20 LAB — URINALYSIS, ROUTINE W REFLEX MICROSCOPIC
Bilirubin Urine: NEGATIVE
Glucose, UA: NEGATIVE mg/dL
Hgb urine dipstick: NEGATIVE
KETONES UR: 15 mg/dL — AB
LEUKOCYTES UA: NEGATIVE
Nitrite: NEGATIVE
PROTEIN: NEGATIVE mg/dL
Specific Gravity, Urine: 1.029 (ref 1.005–1.030)
UROBILINOGEN UA: 1 mg/dL (ref 0.0–1.0)
pH: 7 (ref 5.0–8.0)

## 2014-03-20 MED ORDER — PREDNISONE 50 MG PO TABS
60.0000 mg | ORAL_TABLET | ORAL | Status: AC
  Administered 2014-03-20: 60 mg via ORAL
  Filled 2014-03-20 (×2): qty 1

## 2014-03-20 MED ORDER — TRIAMCINOLONE ACETONIDE 55 MCG/ACT NA AERO: 2.0000 | INHALATION_SPRAY | Freq: Every day | NASAL | Status: DC

## 2014-03-20 MED ORDER — ACETAMINOPHEN 500 MG PO TABS
1000.0000 mg | ORAL_TABLET | Freq: Once | ORAL | Status: AC
  Administered 2014-03-20: 1000 mg via ORAL
  Filled 2014-03-20: qty 2

## 2014-03-20 NOTE — ED Notes (Signed)
Pt states woke up this am with lt ear pain, lt headache with dizziness and SOB

## 2014-03-20 NOTE — ED Notes (Signed)
MD at bedside. 

## 2014-03-20 NOTE — ED Provider Notes (Signed)
CSN: [id number]     Arrival date & time 03/20/14  [id number] History   First MD Initiated Contact with Patient 03/20/14 [id number]     Chief Complaint  Patient presents with  . Otalgia  . Shortness of Breath      HPI  Patient presents with concern headache. Has complaints of left ear discomfort, lightheadedness, dizziness, dyspnea. Patient was in his usual state of health until he awoke this morning. [place] others contain focally about the left temporal area, with no visual loss no hearing loss, but with sore persistent pain. There is mild associated dyspnea, lightheadedness, but no syncope, no chest pain, no nausea, no vomiting, no diarrhea. The patient denies any chronic medical conditions, but has documented history of bipolar disorder. Patient denies fevers, chills Patient is here with his father.   Past Medical History  Diagnosis Date  . Bipolar 1 disorder   . High cholesterol    Past Surgical History  Procedure Laterality Date  . Pilonidal cyst excision    . Ganglion cyst excision     No family history on file. History  Substance Use Topics  . Smoking status: Never Smoker   . Smokeless tobacco: Never Used  . Alcohol Use: No    Review of Systems  Constitutional:       Per HPI, otherwise negative  HENT: Positive for congestion, rhinorrhea and sinus pressure.   Respiratory:       Per HPI, otherwise negative  Cardiovascular:       Per HPI, otherwise negative  Gastrointestinal: Negative for vomiting.  Endocrine:       Negative aside from HPI  Genitourinary:       Neg aside from HPI   Musculoskeletal:       Per HPI, otherwise negative  Skin: Negative.   Neurological: Negative for syncope.      Allergies  Aspirin; Ibuprofen; Metformin and related; and Ritalin  Home Medications   Prior to Admission medications   Medication Sig Start Date End Date Taking? Authorizing Provider  acetaminophen (TYLENOL) 500 MG tablet Take 1,000 mg by mouth every 6 (six) hours as  needed. For pain     Historical Provider, MD  simvastatin (ZOCOR) 10 MG tablet Take 10 mg by mouth at bedtime.      Historical Provider, MD   BP 129/79  Pulse 68  Temp(Src) 98.9 F (37.2 C) (Oral)  Resp 20  Ht 6\' 2"  (1.88 m)  Wt 316 lb (143.337 kg)  BMI 40.55 kg/m2  SpO2 100% Physical Exam  Nursing note and vitals reviewed. Constitutional: He is oriented to person, place, and time. He appears well-developed. No distress.  HENT:  Head: Normocephalic and atraumatic.  Right Ear: Hearing, tympanic membrane, external ear and ear canal normal.  Left Ear: Hearing, tympanic membrane, external ear and ear canal normal.  Eyes: Conjunctivae and EOM are normal.  Cardiovascular: Normal rate and regular rhythm.   Pulmonary/Chest: Effort normal. No stridor. No respiratory distress.  Abdominal: He exhibits no distension. There is no tenderness. There is no rebound.  Musculoskeletal: He exhibits no edema.  Lymphadenopathy:    He has no cervical adenopathy.  Neurological: He is alert and oriented to person, place, and time. He displays normal reflexes. No cranial nerve deficit. He exhibits normal muscle tone. Coordination normal.  Skin: Skin is warm and dry. No rash noted. No erythema.  Psychiatric: He has a normal mood and affect. His behavior is normal. Thought content normal.    ED Course  Procedures (including critical care time) Labs Review Labs Reviewed  URINALYSIS, ROUTINE W REFLEX MICROSCOPIC   Urinalysis reviewed   I reviewed the electronic medical record. Patient previously was seen here with some frequency for a variety of complaints, but has not been here in some time.   MDM   Gen. well-appearing male presents with concern headache, ear pain. On exam he is awake, alert, afebrile condition in a physical exam. Patient's urinalysis is unremarkable as well. Patient was somewhat here. Patient's presentation is most consistent with sinus irritation versus headache. Absent  distress, neurologic dysfunction, little physical exam abnormalities, there is little concern for CNS abnormality or occult infection purulent patient started on a course of anti-inflammatories, inhaled steroids, discharged to follow up with primary care.     Gerhard Munchobert Sharena Dibenedetto, MD 03/20/14 [phone]

## 2014-03-20 NOTE — Discharge Instructions (Signed)
As discussed, your evaluation today has been largely reassuring.  But, it is important that you monitor your condition carefully, and do not hesitate to return to the ED if you develop new, or concerning changes in your condition.  Your headache is likely due in part to sinus inflammation.  Otherwise, please follow-up with your physician for appropriate ongoing care.

## 2015-10-17 ENCOUNTER — Emergency Department (HOSPITAL_COMMUNITY)
Admission: EM | Admit: 2015-10-17 | Discharge: 2015-10-18 | Disposition: A | Discharge status: Other Acute Inpatient Hospital | Payer: Medicaid Other | Attending: Emergency Medicine | Admitting: Emergency Medicine

## 2015-10-17 ENCOUNTER — Encounter (HOSPITAL_COMMUNITY): Payer: Self-pay | Admitting: Emergency Medicine

## 2015-10-17 DIAGNOSIS — Z046 Encounter for general psychiatric examination, requested by authority: Secondary | ICD-10-CM | POA: Diagnosis present

## 2015-10-17 DIAGNOSIS — E7800 Pure hypercholesterolemia, unspecified: Secondary | ICD-10-CM | POA: Insufficient documentation

## 2015-10-17 DIAGNOSIS — Z79899 Other long term (current) drug therapy: Secondary | ICD-10-CM | POA: Insufficient documentation

## 2015-10-17 DIAGNOSIS — F251 Schizoaffective disorder, depressive type: Secondary | ICD-10-CM

## 2015-10-17 DIAGNOSIS — F250 Schizoaffective disorder, bipolar type: Secondary | ICD-10-CM | POA: Insufficient documentation

## 2015-10-17 DIAGNOSIS — R45851 Suicidal ideations: Secondary | ICD-10-CM

## 2015-10-17 LAB — COMPREHENSIVE METABOLIC PANEL
ALBUMIN: 4.3 g/dL (ref 3.5–5.0)
ALK PHOS: 62 U/L (ref 38–126)
ALT: 25 U/L (ref 17–63)
AST: 23 U/L (ref 15–41)
Anion gap: 8 (ref 5–15)
BUN: 10 mg/dL (ref 6–20)
CHLORIDE: 103 mmol/L (ref 101–111)
CO2: 25 mmol/L (ref 22–32)
CREATININE: 0.94 mg/dL (ref 0.61–1.24)
Calcium: 9.5 mg/dL (ref 8.9–10.3)
GFR calc Af Amer: >60 mL/min (ref >60)
GFR calc non Af Amer: >60 mL/min (ref >60)
GLUCOSE: 106 mg/dL — AB (ref 65–99)
Potassium: 3.7 mmol/L (ref 3.5–5.1)
SODIUM: 136 mmol/L (ref 135–145)
Total Bilirubin: 1.5 mg/dL — ABNORMAL HIGH (ref 0.3–1.2)
Total Protein: 7.6 g/dL (ref 6.5–8.1)

## 2015-10-17 LAB — ACETAMINOPHEN LEVEL: Acetaminophen (Tylenol), Serum: <10 ug/mL — ABNORMAL LOW (ref 10–30)

## 2015-10-17 LAB — CBC
HCT: 41.6 % (ref 39.0–52.0)
HEMOGLOBIN: 13.7 g/dL (ref 13.0–17.0)
MCH: 27 pg (ref 26.0–34.0)
MCHC: 32.9 g/dL (ref 30.0–36.0)
MCV: 82.1 fL (ref 78.0–100.0)
Platelets: 346 10*3/uL (ref 150–400)
RBC: 5.07 MIL/uL (ref 4.22–5.81)
RDW: 13.7 % (ref 11.5–15.5)
WBC: 10.5 10*3/uL (ref 4.0–10.5)

## 2015-10-17 LAB — RAPID URINE DRUG SCREEN, HOSP PERFORMED
AMPHETAMINES: NOT DETECTED
BARBITURATES: NOT DETECTED
Benzodiazepines: NOT DETECTED
Cocaine: NOT DETECTED
Opiates: NOT DETECTED
TETRAHYDROCANNABINOL: NOT DETECTED

## 2015-10-17 LAB — SALICYLATE LEVEL: Salicylate Lvl: <4 mg/dL (ref 2.8–30.0)

## 2015-10-17 LAB — ETHANOL: Alcohol, Ethyl (B): <5 mg/dL (ref <5)

## 2015-10-17 MED ORDER — NICOTINE 21 MG/24HR TD PT24: 21.0000 mg | MEDICATED_PATCH | Freq: Every day | TRANSDERMAL | Status: DC | PRN

## 2015-10-17 MED ORDER — ACETAMINOPHEN 325 MG PO TABS: 650.0000 mg | ORAL_TABLET | ORAL | Status: DC | PRN

## 2015-10-17 MED ORDER — ONDANSETRON HCL 4 MG PO TABS: 4.0000 mg | ORAL_TABLET | Freq: Three times a day (TID) | ORAL | Status: DC | PRN

## 2015-10-17 MED ORDER — ALUM & MAG HYDROXIDE-SIMETH 200-200-20 MG/5ML PO SUSP: 30.0000 mL | ORAL | Status: DC | PRN

## 2015-10-17 MED ORDER — ZOLPIDEM TARTRATE 5 MG PO TABS: 5.0000 mg | ORAL_TABLET | Freq: Every evening | ORAL | Status: DC | PRN

## 2015-10-17 NOTE — ED Notes (Signed)
[facility] Expected date:  Expected time:  Means of arrival:  Comments: Hold for triage 6

## 2015-10-17 NOTE — ED Notes (Signed)
[id number] Expected date:  Expected time:  Means of arrival:  Comments[facility]

## 2015-10-17 NOTE — ED Provider Notes (Signed)
CSN: [id number]     Arrival date & time 10/17/15  1421 History   First MD Initiated Contact with Patient 10/17/15 1509     Chief Complaint  Patient presents with  . Suicidal  . Medical Clearance     (Consider location/radiation/quality/duration/timing/severity/associated sxs/prior Treatment) HPI   [name] is a 29 y.o. male presents for evaluation of suicidal ideation. He reports feeling stressed and this causes him to be "50-50 suicidal. He has had thoughts of cutting his neck, and burning down his house, to kill himself. He does not feel at this time that he left on the plan. Today he was talking to his therapist about things that she sent him here for further evaluation. He lives with his mother, and his sibling. He reports that his mother is a boyfriend, and his godmother, are continually criticizing him and telling the patient that he is responsible for his mother's poor health. He denies a suicide attempt recently but in the remote past, took an overdose of Tylenol. He is taking his usual medications. There are no other known modifying factors.   Past Medical History  Diagnosis Date  . Bipolar 1 disorder (HCC)   . High cholesterol    Past Surgical History  Procedure Laterality Date  . Pilonidal cyst excision    . Ganglion cyst excision     History reviewed. No pertinent family history. Social History  Substance Use Topics  . Smoking status: Never Smoker   . Smokeless tobacco: Never Used  . Alcohol Use: No    Review of Systems  All other systems reviewed and are negative.     Allergies  Aspirin; Ibuprofen; Metformin and related; Ritalin; and Shellfish-derived products  Home Medications   Prior to Admission medications   Medication Sig Start Date End Date Taking? Authorizing Provider  atropine 1 % ophthalmic solution  01/28/10  Yes Historical Provider, MD  divalproex (DEPAKOTE ER) 500 MG 24 hr tablet  03/20/10  Yes Historical Provider, MD  glipiZIDE  (GLUCOTROL) 10 MG tablet  03/20/10  Yes Historical Provider, MD  paliperidone ([name] SUSTENNA) 156 MG/ML SUSP injection  01/28/10  Yes Historical Provider, MD  promethazine (PHENERGAN) 25 MG tablet  02/03/10  Yes Historical Provider, MD  risperiDONE (RISPERDAL) 2 MG tablet  02/21/10  Yes Historical Provider, MD  risperiDONE microspheres (RISPERDAL CONSTA) 25 MG injection  04/10/10  Yes Historical Provider, MD  sitaGLIPtin (JANUVIA) 100 MG tablet  04/06/10  Yes Historical Provider, MD  acetaminophen (TYLENOL) 500 MG tablet Take 1,000 mg by mouth every 6 (six) hours as needed. For pain     Historical Provider, MD  simvastatin (ZOCOR) 10 MG tablet Take 10 mg by mouth at bedtime.      Historical Provider, MD  triamcinolone (NASACORT AQ) 55 MCG/ACT AERO nasal inhaler Place 2 sprays into the nose daily. 03/20/14 03/24/14  Gerhard Munchobert Lockwood, MD   BP 154/91 mmHg  Pulse 83  Temp(Src) 97.8 F (36.6 C) (Oral)  Resp 18  SpO2 96% Physical Exam  Constitutional: He is oriented to person, place, and time. He appears well-developed and well-nourished. No distress.  HENT:  Head: Normocephalic and atraumatic.  Right Ear: External ear normal.  Left Ear: External ear normal.  Eyes: Conjunctivae and EOM are normal. Pupils are equal, round, and reactive to light.  Neck: Normal range of motion and phonation normal. Neck supple.  Cardiovascular: Normal rate, regular rhythm and normal heart sounds.   Pulmonary/Chest: Effort normal and breath sounds normal. He exhibits no  bony tenderness.  Abdominal: Soft. There is no tenderness.  Musculoskeletal: Normal range of motion.  Neurological: He is alert and oriented to person, place, and time. No cranial nerve deficit or sensory deficit. He exhibits normal muscle tone. Coordination normal.  Skin: Skin is warm, dry and intact.  Psychiatric: He has a normal mood and affect. His behavior is normal. Judgment and thought content normal.  He is lucid, calm, and interactive.  Nursing  note and vitals reviewed.   ED Course  Procedures (including critical care time) ' Medications  acetaminophen (TYLENOL) tablet 650 mg (not administered)  zolpidem (AMBIEN) tablet 5 mg (not administered)  nicotine (NICODERM CQ - dosed in mg/24 hours) patch 21 mg (not administered)  ondansetron (ZOFRAN) tablet 4 mg (not administered)  alum & mag hydroxide-simeth (MAALOX/MYLANTA) 200-200-20 MG/5ML suspension 30 mL (not administered)    Patient Vitals for the past 24 hrs:  BP Temp Temp src Pulse Resp SpO2  10/17/15 1437 154/91 mmHg 97.8 F (36.6 C) Oral 83 18 96 %    4:52 PM Reevaluation with update and discussion. After initial assessment and treatment, an updated evaluation reveals no change in clinical status. He is medically cleared for treatment by psychiatry service. TTS consultation requested. Chayah Mckee L   Labs Review Labs Reviewed  COMPREHENSIVE METABOLIC PANEL - Abnormal; Notable for the following:    Glucose, Bld 106 (*)    Total Bilirubin 1.5 (*)    All other components within normal limits  ACETAMINOPHEN LEVEL - Abnormal; Notable for the following:    Acetaminophen (Tylenol), Serum <10 (*)    All other components within normal limits  ETHANOL  SALICYLATE LEVEL  CBC  URINE RAPID DRUG SCREEN, HOSP PERFORMED    Imaging Review No results found. I have personally reviewed and evaluated these images and lab results as part of my medical decision-making.   EKG Interpretation None      MDM   Final diagnoses:  Suicidal ideation     Bipolar disorder, with suicidal ideation. Patient has an indistinct plan for suicide.  Nursing Notes Reviewed/ Care Coordinated, and agree without changes. Applicable Imaging Reviewed.  Interpretation of Laboratory Data incorporated into ED treatment  Plan- as per oncoming provider team in conjunction with TTS   Mancel Bale, MD 10/18/15 0008

## 2015-10-17 NOTE — ED Notes (Signed)
Pt was speaking with his therapist and told them he was thinking of committing suicide. The therapist reccommended he come in to be voluntarily committed. Pt states "I'm confused. It's like a 50/50 thing. I had thought about different methods, like cutting my neck or burning the house down with me in it. I told her that I wasn't sure. I just don't want to get in trouble."

## 2015-10-17 NOTE — ED Notes (Signed)
Pt presents with SI, plan to cut his throat.  Pt reports he overdosed on Tylenol a few years ago.  Denies HI, denies AVH.  Denies street drugs or alcohol abuse.  Denies feeling hopeless.  Pt states he has been depressed for the past few months.  Pt reports he attends a PXR program 5 times a week.  Diagnosed with Bipolar DO in the past.  AAO x 3, calm & cooperative, monitoring for safety, Q  15 min checks in effect, no distress noted.

## 2015-10-17 NOTE — ED Notes (Signed)
Brian JunesSydney Olson father [phone].

## 2015-10-18 ENCOUNTER — Encounter (HOSPITAL_COMMUNITY): Payer: Self-pay | Admitting: *Deleted

## 2015-10-18 ENCOUNTER — Observation Stay (HOSPITAL_COMMUNITY): Admission: AD | Admit: 2015-10-18 | Payer: Medicaid Other | Source: Intra-hospital | Admitting: Psychiatry

## 2015-10-18 ENCOUNTER — Inpatient Hospital Stay (HOSPITAL_COMMUNITY)
Admission: EM | Admit: 2015-10-18 | Discharge: 2015-10-23 | DRG: 885 | Disposition: A | Discharge status: Home / Self Care | Payer: Medicaid Other | Source: Intra-hospital | Attending: Psychiatry | Admitting: Psychiatry

## 2015-10-18 DIAGNOSIS — F251 Schizoaffective disorder, depressive type: Secondary | ICD-10-CM | POA: Diagnosis present

## 2015-10-18 DIAGNOSIS — R45851 Suicidal ideations: Secondary | ICD-10-CM

## 2015-10-18 DIAGNOSIS — F250 Schizoaffective disorder, bipolar type: Secondary | ICD-10-CM | POA: Diagnosis present

## 2015-10-18 HISTORY — DX: Schizoaffective disorder, depressive type: F25.1

## 2015-10-18 MED ORDER — PALIPERIDONE ER 6 MG PO TB24
6.0000 mg | ORAL_TABLET | Freq: Every day | ORAL | Status: DC
  Administered 2015-10-19: 6 mg via ORAL
  Filled 2015-10-18 (×3): qty 1

## 2015-10-18 MED ORDER — ATROPINE SULFATE 1 % OP SOLN
1.0000 [drp] | Freq: Two times a day (BID) | OPHTHALMIC | Status: DC
  Filled 2015-10-18: qty 2

## 2015-10-18 MED ORDER — OXCARBAZEPINE 300 MG PO TABS
300.0000 mg | ORAL_TABLET | Freq: Two times a day (BID) | ORAL | Status: DC
  Administered 2015-10-18: 300 mg via ORAL
  Filled 2015-10-18 (×2): qty 1

## 2015-10-18 MED ORDER — SIMVASTATIN 10 MG PO TABS
10.0000 mg | ORAL_TABLET | Freq: Every day | ORAL | Status: DC
  Administered 2015-10-18: 10 mg via ORAL
  Filled 2015-10-18 (×4): qty 1

## 2015-10-18 MED ORDER — LORAZEPAM 2 MG/ML IJ SOLN: 2.0000 mg | Freq: Once | INTRAMUSCULAR | Status: DC

## 2015-10-18 MED ORDER — ZIPRASIDONE MESYLATE 20 MG IM SOLR: 20.0000 mg | Freq: Once | INTRAMUSCULAR | Status: DC

## 2015-10-18 MED ORDER — GLIPIZIDE 10 MG PO TABS
10.0000 mg | ORAL_TABLET | Freq: Every day | ORAL | Status: DC
  Filled 2015-10-18: qty 1

## 2015-10-18 MED ORDER — AMANTADINE HCL 100 MG PO CAPS
100.0000 mg | ORAL_CAPSULE | Freq: Two times a day (BID) | ORAL | Status: DC
Start: 2015-10-18 — End: 2015-10-19
  Administered 2015-10-19: 100 mg via ORAL
  Filled 2015-10-18 (×6): qty 1

## 2015-10-18 MED ORDER — AMANTADINE HCL 100 MG PO CAPS
100.0000 mg | ORAL_CAPSULE | Freq: Two times a day (BID) | ORAL | Status: DC
  Administered 2015-10-18: 100 mg via ORAL
  Filled 2015-10-18 (×2): qty 1

## 2015-10-18 MED ORDER — ACETAMINOPHEN 325 MG PO TABS: 650.0000 mg | ORAL_TABLET | Freq: Four times a day (QID) | ORAL | Status: DC | PRN

## 2015-10-18 MED ORDER — MAGNESIUM HYDROXIDE 400 MG/5ML PO SUSP: 30.0000 mL | Freq: Every day | ORAL | Status: DC | PRN

## 2015-10-18 MED ORDER — GLIPIZIDE 10 MG PO TABS
10.0000 mg | ORAL_TABLET | Freq: Every day | ORAL | Status: DC
  Filled 2015-10-18 (×3): qty 1

## 2015-10-18 MED ORDER — ALUM & MAG HYDROXIDE-SIMETH 200-200-20 MG/5ML PO SUSP: 30.0000 mL | ORAL | Status: DC | PRN

## 2015-10-18 MED ORDER — PALIPERIDONE ER 6 MG PO TB24
6.0000 mg | ORAL_TABLET | Freq: Every day | ORAL | Status: DC
  Administered 2015-10-18: 6 mg via ORAL
  Filled 2015-10-18: qty 1

## 2015-10-18 MED ORDER — ATROPINE SULFATE 1 % OP SOLN
1.0000 [drp] | Freq: Two times a day (BID) | OPHTHALMIC | Status: DC
  Administered 2015-10-19: 1 [drp] via OPHTHALMIC
  Filled 2015-10-18 (×2): qty 2

## 2015-10-18 MED ORDER — SIMVASTATIN 10 MG PO TABS
10.0000 mg | ORAL_TABLET | Freq: Every day | ORAL | Status: DC
  Filled 2015-10-18: qty 1

## 2015-10-18 MED ORDER — TRAZODONE HCL 100 MG PO TABS
100.0000 mg | ORAL_TABLET | Freq: Every day | ORAL | Status: DC
Start: 2015-10-18 — End: 2015-10-18

## 2015-10-18 MED ORDER — TRAZODONE HCL 100 MG PO TABS
100.0000 mg | ORAL_TABLET | Freq: Every day | ORAL | Status: DC
  Administered 2015-10-18: 100 mg via ORAL
  Filled 2015-10-18 (×3): qty 1

## 2015-10-18 MED ORDER — OXCARBAZEPINE 300 MG PO TABS
300.0000 mg | ORAL_TABLET | Freq: Two times a day (BID) | ORAL | Status: DC
  Administered 2015-10-19: 300 mg via ORAL
  Filled 2015-10-18 (×4): qty 1

## 2015-10-18 MED ORDER — DIPHENHYDRAMINE HCL 50 MG/ML IJ SOLN: 50.0000 mg | Freq: Once | INTRAMUSCULAR | Status: DC

## 2015-10-18 NOTE — ED Notes (Signed)
[facility] called for transportation request.

## 2015-10-18 NOTE — BH Assessment (Addendum)
Tele Assessment Note   Brian ReasonsMichael Olson is a single, Caucasian, unemployed 29 y.o. male [facility] the suggestion of his therapist with [facility]. Pt told therapist that he was experiencing SI with thoughts of cutting his neck or burning his house down with himself inside it in order to kill himself. Therefore, she sent pt to ED for further evaluation. Pt reports feeling stressed and having "grief issues" after the loss of his girlfriend a couple of months ago. Pt reports feeling depressed but does not endorse any depressive sx other than SI. He denies problems with sleep or appetite, denies vegetative sx, fatigue, crying spells, irritability, social isolation, and anhedonia. Pt denies currently having any intent to harm himself. He denies A/VH, HI, self-harming behaviors, or SA. Pt lives with his mother, father, and sister. Additional stressors include family reportedly criticizing the pt and telling him that he's responsible for his mother's poor health. Pt denies any recent suicide attempt but endorses overdosing on Tylenol once in the distant past. He endorses multiple psychiatric hospitalizations in the past, including [facility]. He denies any recent admissions. He reports compliance with medications and says that he attends a PSR program through [facility] 5x per week. Family hx is positive for MI and SA. Pt denies hx of abuse or trauma. He reports prior dx of Bipolar Disorder and Borderline Personality Disorder.  Pt presents with disheveled appearance, poor eye-contact, and is initially lying in bed sleeping. He wakes up to engage in assessment but keeps his eyes closed the entire time. Thought process is circumstantial but there is no evidence of delusional thought content. Speech is logical and coherent. There is no indication that pt is responding to internal stimuli. Mood is depressed and affect is blunted. Pt is well-oriented but has limited  insight into his mental illness.  - Per Hulan FessIjeoma Nwaeze, NP, Admission to Obs unit is recommended until further assessment can be done tomorrow.   Diagnosis:  296.50 Bipolar I disorder, Current or most recent episode depressed, Unspecified V62.82 Bereavement   Past Medical History:  Past Medical History  Diagnosis Date  . Bipolar 1 disorder (HCC)   . High cholesterol     Past Surgical History  Procedure Laterality Date  . Pilonidal cyst excision    . Ganglion cyst excision      Family History: History reviewed. No pertinent family history.  Social History:  reports that he has never smoked. He has never used smokeless tobacco. He reports that he does not drink alcohol or use illicit drugs.  Additional Social History:  Alcohol / Drug Use Pain Medications: See PTA med list Prescriptions: See PTA med list Over the Counter: See PTA med list History of alcohol / drug use?: No history of alcohol / drug abuse  CIWA: CIWA-Ar BP: 124/71 mmHg Pulse Rate: 80 COWS:    PATIENT STRENGTHS: (choose at least two) Communication skills Supportive family/friends  Allergies:  Allergies  Allergen Reactions  . Aspirin     Bleeding in urine  . Ibuprofen     "bleeding in urine"  . Metformin And Related Diarrhea    Leg muscles became weak  . Ritalin [Methylphenidate Hcl]     Suicidal thoughts  . Shellfish-Derived Products Hives    Home Medications:  (Not in a hospital admission)  OB/GYN Status:  No LMP for male patient.  General Assessment Data Location of Assessment: [facility] TTS Assessment: In system Is this a Tele or Face-to-Face  Assessment?: Tele Assessment Is this an Initial Assessment or a Re-assessment for this encounter?: Initial Assessment Marital status: Single Maiden name: NA Is patient pregnant?: No Pregnancy Status: No Living Arrangements: Parent, Other relatives Can pt return to current living arrangement?: Yes Admission Status: Voluntary Is patient capable of  signing voluntary admission?: Yes Referral Source: Other (Pt's therapist) Insurance type: Medicaid     Crisis Care Plan Living Arrangements: Parent, Other relatives Name of Psychiatrist: [facility] Name of Therapist: [facility]  Education Status Is patient currently in school?: No Current Grade: na Highest grade of school patient has completed: na Name of school: na Contact person: na  Risk to self with the past 6 months Suicidal Ideation: Yes-Currently Present Has patient been a risk to self within the past 6 months prior to admission? : No Suicidal Intent: No Has patient had any suicidal intent within the past 6 months prior to admission? : No Is patient at risk for suicide?: Yes Suicidal Plan?: Yes-Currently Present Has patient had any suicidal plan within the past 6 months prior to admission? : No Specify Current Suicidal Plan: Burn house down, cut throat Access to Means: Yes Specify Access to Suicidal Means: Access to lighter, sharp objects What has been your use of drugs/alcohol within the last 12 months?: None Previous Attempts/Gestures: Yes How many times?: 1 Other Self Harm Risks: None known Triggers for Past Attempts: Unknown Intentional Self Injurious Behavior: None Family Suicide History: No Recent stressful life event(s): Loss (Comment) (Loss of girlfriend) Persecutory voices/beliefs?: No Depression: Yes Depression Symptoms: Despondent, Feeling worthless/self pity Substance abuse history and/or treatment for substance abuse?: No Suicide prevention information given to non-admitted patients: Not applicable  Risk to Others within the past 6 months Homicidal Ideation: No Does patient have any lifetime risk of violence toward others beyond the six months prior to admission? : No Thoughts of Harm to Others: No Current Homicidal Intent: No Current Homicidal Plan: No Access to Homicidal Means: No Identified Victim: NA History of harm to  others?: No Assessment of Violence: None Noted Violent Behavior Description: No known hx of violence Does patient have access to weapons?: No Criminal Charges Pending?: No Does patient have a court date: No Is patient on probation?: No  Psychosis Hallucinations: None noted Delusions: None noted  Mental Status Report Appearance/Hygiene: Disheveled Eye Contact: Poor Motor Activity: Freedom of movement Speech: Logical/coherent Level of Consciousness: Sleeping Mood: Depressed Affect: Blunted Anxiety Level: None Thought Processes: Circumstantial Judgement: Partial Orientation: Person, Place, Time, Situation Obsessive Compulsive Thoughts/Behaviors: None  Cognitive Functioning Concentration: Decreased Memory: Recent Intact IQ: Average Insight: Poor Impulse Control: Fair Appetite: Good Weight Loss: 0 Weight Gain: 0 Sleep: No Change Total Hours of Sleep: 8 Vegetative Symptoms: None  ADLScreening [facility] Assessment Services) Patient's cognitive ability adequate to safely complete daily activities?: Yes Patient able to express need for assistance with ADLs?: Yes Independently performs ADLs?: Yes (appropriate for developmental age)  Prior Inpatient Therapy Prior Inpatient Therapy: Yes Prior Therapy Dates: multiple Prior Therapy Facilty/Provider(s): [facility], [facility], etc Reason for Treatment: Depression  Prior Outpatient Therapy Prior Outpatient Therapy: Yes Prior Therapy Dates: Current Prior Therapy Facilty/Provider(s): [facility] Reason for Treatment: PSR program Does patient have an ACCT team?: No Does patient have Intensive In-House Services?  : No Does patient have [facility] services? : No Does patient have P4CC services?: No  ADL Screening (condition at time of admission) Patient's cognitive ability adequate to safely complete daily activities?: Yes Is the patient deaf or have difficulty hearing?: No  Does the patient have difficulty seeing, even when wearing  glasses/contacts?: No Does the patient have difficulty concentrating, remembering, or making decisions?: No Patient able to express need for assistance with ADLs?: Yes Does the patient have difficulty dressing or bathing?: No Independently performs ADLs?: Yes (appropriate for developmental age) Does the patient have difficulty walking or climbing stairs?: No Weakness of Legs: None Weakness of Arms/Hands: None  Home Assistive Devices/Equipment Home Assistive Devices/Equipment: None    Abuse/Neglect Assessment (Assessment to be complete while patient is alone) Physical Abuse: Denies Verbal Abuse: Denies Sexual Abuse: Denies Exploitation of patient/patient's resources: Denies Self-Neglect: Denies Values / Beliefs Cultural Requests During Hospitalization: None Spiritual Requests During Hospitalization: None   Advance Directives (For Healthcare) Does patient have an advance directive?: No Would patient like information on creating an advanced directive?: No - patient declined information    Additional Information 1:1 In Past 12 Months?: No CIRT Risk: No Elopement Risk: No Does patient have medical clearance?: Yes     Disposition: Per Hulan Fess, NP, Admission to Obs unit is recommended until further assessment can be done tomorrow.  Disposition Initial Assessment Completed for this Encounter: Yes  Cyndie Mull, [facility]  10/18/2015 1:07 AM

## 2015-10-18 NOTE — ED Notes (Signed)
Patient upset over inpatient status.  Support and encouragement offered.  States he will take ordered po medications.

## 2015-10-18 NOTE — Consult Note (Signed)
[facility] Consult   Reason for Consult: worsening depression, suicidal thoughts with plan Referring Physician: EDP Patient Identification: Osiah Haring MRN:  [id number] Principal Diagnosis: Schizoaffective disorder, depressive type [facility]) Diagnosis:   Patient Active Problem List   Diagnosis Date Noted  . Schizoaffective disorder, depressive type ([place]) [F25.1] 10/18/2015    Priority: High    Total Time spent with patient: 45 minutes  Subjective:   Nylan Nevel is a 29 y.o. male patient admitted with worsening depression and suicidal thoughts.  HPI: Gabriella Guile is a 29 y.o. male presenting to [facility] at the suggestion of his therapist with [facility]. Pt told therapist that he was experiencing suicidal thoughts with plan to cut his neck or burning his house down with himself inside it in order to kill himself. Patient reports that his girlfriend died recently and still has has been stressed out and having "grief issues". Pt reports feeling depressed, hopeless, worthless, having difficulty sleeping, recurrent suicidal thoughts, excessive worries, irritable mood and poor concentration. Patient has history of taking depakote, [name], Risperdal for Bipolar disorder. Patient denies drugs, alcohol use, psychosis, delusional thinking but unable to contract for safety. Pt is well-oriented but has limited insight into his mental illness.  Past Psychiatric History: Bipolar depression  Risk to Self: Suicidal Ideation: Yes-Currently Present Suicidal Intent: No Is patient at risk for suicide?: Yes Suicidal Plan?: Yes-Currently Present Specify Current Suicidal Plan: Burn house down, cut throat Access to Means: Yes Specify Access to Suicidal Means: Access to lighter, sharp objects What has been your use of drugs/alcohol within the last 12 months?: None How many times?: 1 Other Self Harm Risks: None known Triggers for Past Attempts: Unknown Intentional Self Injurious  Behavior: None Risk to Others: Homicidal Ideation: No Thoughts of Harm to Others: No Current Homicidal Intent: No Current Homicidal Plan: No Access to Homicidal Means: No Identified Victim: NA History of harm to others?: No Assessment of Violence: None Noted Violent Behavior Description: No known hx of violence Does patient have access to weapons?: No Criminal Charges Pending?: No Does patient have a court date: No Prior Inpatient Therapy: Prior Inpatient Therapy: Yes Prior Therapy Dates: multiple Prior Therapy Facilty/Provider(s): [place], [place], etc Reason for Treatment: Depression Prior Outpatient Therapy: Prior Outpatient Therapy: Yes Prior Therapy Dates: Current Prior Therapy Facilty/Provider(s): [facility] Reason for Treatment: PSR program Does patient have an ACCT team?: No Does patient have Intensive In-House Services?  : No Does patient have [facility] services? : No Does patient have P4CC services?: No  Past Medical History:  Past Medical History  Diagnosis Date  . Bipolar 1 disorder ([place])   . High cholesterol     Past Surgical History  Procedure Laterality Date  . Pilonidal cyst excision    . Ganglion cyst excision     Family History: History reviewed. No pertinent family history. Family Psychiatric  History: Social History:  History  Alcohol Use No     History  Drug Use No    Social History   Social History  . Marital Status: Single    Spouse Name: N/A  . Number of Children: N/A  . Years of Education: N/A   Social History Main Topics  . Smoking status: Never Smoker   . Smokeless tobacco: Never Used  . Alcohol Use: No  . Drug Use: No  . Sexual Activity: Not Asked   Other Topics Concern  . None   Social History Narrative   Additional Social History:    Pain Medications: See  PTA med list Prescriptions: See PTA med list Over the Counter: See PTA med list History of alcohol / drug use?: No history of alcohol / drug abuse                      Allergies:   Allergies  Allergen Reactions  . Aspirin     Bleeding in urine  . Ibuprofen     "bleeding in urine"  . Metformin And Related Diarrhea    Leg muscles became weak  . Ritalin [Methylphenidate Hcl]     Suicidal thoughts  . Shellfish-Derived Products Hives    Labs:  Results for orders placed or performed during the hospital encounter of 10/17/15 (from the past 48 hour(s))  Comprehensive metabolic panel     Status: Abnormal   Collection Time: 10/17/15  3:00 PM  Result Value Ref Range   Sodium 136 135 - 145 mmol/L   Potassium 3.7 3.5 - 5.1 mmol/L   Chloride 103 101 - 111 mmol/L   CO2 25 22 - 32 mmol/L   Glucose, Bld 106 (H) 65 - 99 mg/dL   BUN 10 6 - 20 mg/dL   Creatinine, Ser 0.94 0.61 - 1.24 mg/dL   Calcium 9.5 8.9 - 10.3 mg/dL   Total Protein 7.6 6.5 - 8.1 g/dL   Albumin 4.3 3.5 - 5.0 g/dL   AST 23 15 - 41 U/L   ALT 25 17 - 63 U/L   Alkaline Phosphatase 62 38 - 126 U/L   Total Bilirubin 1.5 (H) 0.3 - 1.2 mg/dL   GFR calc non Af Amer >60 >60 mL/min   GFR calc Af Amer >60 >60 mL/min    Comment: (NOTE) The eGFR has been calculated using the CKD EPI equation. This calculation has not been validated in all clinical situations. eGFR's persistently <60 mL/min signify possible Chronic Kidney Disease.    Anion gap 8 5 - 15  Ethanol (ETOH)     Status: None   Collection Time: 10/17/15  3:00 PM  Result Value Ref Range   Alcohol, Ethyl (B) <5 <5 mg/dL    Comment:        LOWEST DETECTABLE LIMIT FOR SERUM ALCOHOL IS 5 mg/dL FOR MEDICAL PURPOSES ONLY   Salicylate level     Status: None   Collection Time: 10/17/15  3:00 PM  Result Value Ref Range   Salicylate Lvl <6.8 2.8 - 30.0 mg/dL  Acetaminophen level     Status: Abnormal   Collection Time: 10/17/15  3:00 PM  Result Value Ref Range   Acetaminophen (Tylenol), Serum <10 (L) 10 - 30 ug/mL    Comment:        THERAPEUTIC CONCENTRATIONS VARY SIGNIFICANTLY. A RANGE OF 10-30 ug/mL MAY BE AN  EFFECTIVE CONCENTRATION FOR MANY PATIENTS. HOWEVER, SOME ARE BEST TREATED AT CONCENTRATIONS OUTSIDE THIS RANGE. ACETAMINOPHEN CONCENTRATIONS >150 ug/mL AT 4 HOURS AFTER INGESTION AND >50 ug/mL AT 12 HOURS AFTER INGESTION ARE OFTEN ASSOCIATED WITH TOXIC REACTIONS.   CBC     Status: None   Collection Time: 10/17/15  3:00 PM  Result Value Ref Range   WBC 10.5 4.0 - 10.5 K/uL   RBC 5.07 4.22 - 5.81 MIL/uL   Hemoglobin 13.7 13.0 - 17.0 g/dL   HCT 41.6 39.0 - 52.0 %   MCV 82.1 78.0 - 100.0 fL   MCH 27.0 26.0 - 34.0 pg   MCHC 32.9 30.0 - 36.0 g/dL   RDW 13.7 11.5 - 15.5 %  Platelets 346 150 - 400 K/uL  Urine rapid drug screen (hosp performed) (Not at [facility])     Status: None   Collection Time: 10/17/15  6:19 PM  Result Value Ref Range   Opiates NONE DETECTED NONE DETECTED   Cocaine NONE DETECTED NONE DETECTED   Benzodiazepines NONE DETECTED NONE DETECTED   Amphetamines NONE DETECTED NONE DETECTED   Tetrahydrocannabinol NONE DETECTED NONE DETECTED   Barbiturates NONE DETECTED NONE DETECTED    Comment:        DRUG SCREEN FOR MEDICAL PURPOSES ONLY.  IF CONFIRMATION IS NEEDED FOR ANY PURPOSE, NOTIFY LAB WITHIN 5 DAYS.        LOWEST DETECTABLE LIMITS FOR URINE DRUG SCREEN Drug Class       Cutoff (ng/mL) Amphetamine      1000 Barbiturate      200 Benzodiazepine   416 Tricyclics       384 Opiates          300 Cocaine          300 THC              50     Current Facility-Administered Medications  Medication Dose Route Frequency Provider Last Rate Last Dose  . acetaminophen (TYLENOL) tablet 650 mg  650 mg Oral Q4H PRN Daleen Bo, MD      . alum & mag hydroxide-simeth (MAALOX/MYLANTA) 200-200-20 MG/5ML suspension 30 mL  30 mL Oral PRN Daleen Bo, MD      . amantadine (SYMMETREL) capsule 100 mg  100 mg Oral BID        . atropine 1 % ophthalmic solution 1 drop  1 drop Both Eyes BID PC        . diphenhydrAMINE (BENADRYL) injection 50 mg  50 mg  Intramuscular Once        . [START ON 10/19/2015] glipiZIDE (GLUCOTROL) tablet 10 mg  10 mg Oral QAC breakfast        . LORazepam (ATIVAN) injection 2 mg  2 mg Intramuscular Once        . nicotine (NICODERM CQ - dosed in mg/24 hours) patch 21 mg  21 mg Transdermal Daily PRN Daleen Bo, MD      . ondansetron [facility]) tablet 4 mg  4 mg Oral Q8H PRN Daleen Bo, MD      . Oxcarbazepine (TRILEPTAL) tablet 300 mg  300 mg Oral BID        . paliperidone ([name]) 24 hr tablet 6 mg  6 mg Oral Daily        . simvastatin (ZOCOR) tablet 10 mg  10 mg Oral QHS        . traZODone (DESYREL) tablet 100 mg  100 mg Oral QHS        . ziprasidone (GEODON) injection 20 mg  20 mg Intramuscular Once         Current Outpatient Prescriptions  Medication Sig Dispense Refill  . atropine 1 % ophthalmic solution     . divalproex (DEPAKOTE ER) 500 MG 24 hr tablet     . glipiZIDE (GLUCOTROL) 10 MG tablet     . paliperidone ([name] SUSTENNA) 156 MG/ML SUSP injection     . promethazine (PHENERGAN) 25 MG tablet     . risperiDONE (RISPERDAL) 2 MG tablet     . risperiDONE microspheres (RISPERDAL CONSTA) 25 MG injection     . sitaGLIPtin (JANUVIA) 100 MG tablet     . acetaminophen (TYLENOL) 500 MG tablet Take 1,000 mg by  mouth every 6 (six) hours as needed. For pain     . simvastatin (ZOCOR) 10 MG tablet Take 10 mg by mouth at bedtime.      . triamcinolone (NASACORT AQ) 55 MCG/ACT AERO nasal inhaler Place 2 sprays into the nose daily. 1 Inhaler 0    Musculoskeletal: Strength & Muscle Tone: within normal limits Gait & Station: normal Patient leans: N/A  Psychiatric Specialty Exam: Review of Systems  Constitutional: Negative.   HENT: Negative.   Eyes: Negative.   Respiratory: Negative.   Cardiovascular: Negative.   Gastrointestinal: Negative.   Genitourinary: Negative.   Musculoskeletal: Negative.    Skin: Negative.   Neurological: Negative.   Endo/Heme/Allergies: Negative.   Psychiatric/Behavioral: Positive for depression and suicidal ideas. The patient is nervous/anxious and has insomnia.     Blood pressure 123/78, pulse 72, temperature 97.7 F (36.5 C), temperature source Oral, resp. rate 16, SpO2 97 %.There is no weight on file to calculate BMI.  General Appearance: Casual  Eye Contact::  Minimal  Speech:  Normal Rate  Volume:  Normal  Mood:  Depressed and Irritable  Affect:  Labile  Thought Process:  Circumstantial  Orientation:  Full (Time, Place, and Person)  Thought Content:  Negative  Suicidal Thoughts:  Yes.  with intent/plan  Homicidal Thoughts:  No  Memory:  Immediate;   Fair Recent;   Fair Remote;   Fair  Judgement:  Impaired  Insight:  Lacking  Psychomotor Activity:  Increased  Concentration:  Fair  Recall:  [employer] of Knowledge:Fair  Language: Fair  Akathisia:  No  Handed:  Right  AIMS (if indicated):     Assets:  Communication Skills Social Support  ADL's:  Intact  Cognition: WNL  Sleep:   poor   Treatment Plan Summary: Daily contact with patient to assess and evaluate symptoms and progress in treatment and Medication management  Disposition: Recommend psychiatric Inpatient admission when medically cleared. Supportive therapy provided about ongoing stressors.  Corena Pilgrim, MD 10/18/2015 11:09 AM

## 2015-10-18 NOTE — Tx Team (Addendum)
Initial Interdisciplinary Treatment Plan   PATIENT STRESSORS: Loss of relationship Medication change or noncompliance   PATIENT STRENGTHS: Communication skills Physical Health Supportive family/friends   PROBLEM LIST: Problem List/Patient Goals Date to be addressed Date deferred Reason deferred Estimated date of resolution  Depression 10/17/2016     Suicidal Ideation 10/17/2016     Medication noncompliance 10/17/2016     "Grief Issues"(per tele- assessment note)                                     DISCHARGE CRITERIA:  Ability to meet basic life and health needs Motivation to continue treatment in a less acute level of care Reduction of life-threatening or endangering symptoms to within safe limits Verbal commitment to aftercare and medication compliance  PRELIMINARY DISCHARGE PLAN: Outpatient therapy Return to previous living arrangement  PATIENT/FAMIILY INVOLVEMENT: This treatment plan has been presented to and reviewed with the patient, [name].  The patient and family have been given the opportunity to ask questions and make suggestions.  Cranford MonBeaudry, Caroline Evans 10/18/2015, 6:04 PM

## 2015-10-18 NOTE — B[facility] Group Notes (Signed)
Adult Psychoeducational Group Note  Date:  10/18/2015 Time:  10:12 PM  Group Topic/Focus:  Wrap-Up Group:   The focus of this group is to help patients review their daily goal of treatment and discuss progress on daily workbooks.  Participation Level:  Minimal  Participation Quality:  Attentive  Affect:  Flat  Cognitive:  Alert  Insight: Limited  Engagement in Group:  Limited  Modes of Intervention:  Discussion  Additional Comments:  Patient is a new admit.  His goal is to "learn what I can from here and add to my coping skills".  Caroll RancherLindsay, Jamileth Putzier A 10/18/2015, 10:12 PM

## 2015-10-18 NOTE — Progress Notes (Signed)
Admission note:  Patient is a 29 yo [facility].  Patient has a plan to cut his throat or burn his house down "with me in it."  Patient lives with his parents and sister.  He states that they are supportive.  [facility]  He reported his SI to his therapist and they called [facility] to bring patient to the ED.  Patient states depression has been ongoing for a couple of months.  The trigger was the death of his girlfriend.  When asked when she passed, patient replied "a couple of months ago.  Well, maybe a couple of weeks ago.  Her family will not let me know what happened.  Patient is able to contract for safety.  He presents with pleasant mood; poor eye contact.  He sees a therapist with [facility] twice a week.  [facility]  UDS negative and patient does not drink alcohol.  He denies HI/AVH.  Patient was given nutrition and oriented to room and unit.

## 2015-10-18 NOTE — BHH Counselor (Signed)
Disposition: Per Hulan FessIjeoma Nwaeze, NP, [facility]. Per Louie BunKim B, AC, pt accepted to Obs bed #1 after 8:00 AM.

## 2015-10-18 NOTE — BH Assessment (Signed)
[facility] Assessment Progress Note  Per Brian MinsMojeed Akintayo, MD, this pt requires psychiatric hospitalization at this time.  Brian Heinrichina Tate, RN, [id number].  Pt has signed Voluntary Admission and Consent for Treatment, as well as Consent to Release Information to [facility], pt's outpatient provider, and a notification call has been placed.  Signed forms [facility].  Pt's nurse, Brian BeersLuann, has been notified, and agrees to send original paperwork along with pt via Brian [place], and to call report to [phone].  Brian Canninghomas Baldomero Mirarchi, MA Triage Specialist [phone]

## 2015-10-19 ENCOUNTER — Encounter (HOSPITAL_COMMUNITY): Payer: Self-pay | Admitting: Psychiatry

## 2015-10-19 DIAGNOSIS — R45851 Suicidal ideations: Secondary | ICD-10-CM

## 2015-10-19 DIAGNOSIS — F250 Schizoaffective disorder, bipolar type: Principal | ICD-10-CM

## 2015-10-19 MED ORDER — TRAZODONE HCL 50 MG PO TABS: 50.0000 mg | ORAL_TABLET | Freq: Every evening | ORAL | Status: DC | PRN

## 2015-10-19 MED ORDER — ALBUTEROL SULFATE HFA 108 (90 BASE) MCG/ACT IN AERS: 2.0000 | INHALATION_SPRAY | RESPIRATORY_TRACT | Status: DC | PRN

## 2015-10-19 MED ORDER — DIPHENHYDRAMINE HCL 25 MG PO CAPS
50.0000 mg | ORAL_CAPSULE | Freq: Four times a day (QID) | ORAL | Status: DC | PRN
  Administered 2015-10-19 – 2015-10-20 (×2): 50 mg via ORAL
  Filled 2015-10-19 (×3): qty 2

## 2015-10-19 MED ORDER — FAMOTIDINE 40 MG PO TABS
80.0000 mg | ORAL_TABLET | Freq: Once | ORAL | Status: DC
  Filled 2015-10-19: qty 2

## 2015-10-19 MED ORDER — FAMOTIDINE 20 MG PO TABS
80.0000 mg | ORAL_TABLET | Freq: Once | ORAL | Status: AC
  Administered 2015-10-19: 80 mg via ORAL
  Filled 2015-10-19 (×2): qty 4

## 2015-10-19 MED ORDER — DIPHENHYDRAMINE HCL 25 MG PO CAPS: 25.0000 mg | ORAL_CAPSULE | Freq: Four times a day (QID) | ORAL | Status: DC | PRN

## 2015-10-19 NOTE — Progress Notes (Signed)
Patient presents with new onset rash/ erythema, affecting  legs, forearms, abdomen. None on face, neck or chest at this time. Not papular or vesicular, slightly pruriginous. No mucosal compromise and no difficulties breathing. No systemic symptoms I have inspected rash with RNs and with NPs- [name].  RN suspects may be related to soap/ toiletries used by patient on unit , but could also be related to new medications -  [name], invega . Of note, patient had been on [name] in the past, as per his report, and does not remember any side effects . Plan - As discussed with treatment team/ pharmacy - Will D/C all standing medications for now, Benadryl 50 mgrs  And Pepcid 80 mgrs x 1 now .  Will monitor closely   [name] , MD

## 2015-10-19 NOTE — Progress Notes (Signed)
Patient has a red rash along his left arm.  It appears it is starting on his right also.  He is asymptomatic at this time.  Will discuss with Dr. Jama Flavorsobos.

## 2015-10-19 NOTE — B[facility] Group Notes (Signed)
[facility] Group Notes:  (Clinical Social Work)  10/19/2015     1:15-2:15PM  Summary of Progress/Problems:   The main focus of today's process group was to learn how to use a decisional balance exercise to move forward in the Stages of Change, which were described and discussed.  Patients listed needs on the whiteboard and unhealthy coping techniques often used to fill needs.  Motivational Interviewing and the whiteboard were utilized to help patients explore in depth the perceived benefits and costs of unhealthy coping techniques, as well as the  benefits and costs of replacing that with a healthy coping skills.  A handout was distributed for patients to be able to do this exercise for themselves.   The patient expressed that their own unhealthy coping involves being stubborn, arguing, and deliberately annoying others.  He listens to music and opens up to others as positive coping skills.  He contributed well and consistently throughout group, was open to suggestions.  Type of Therapy:  Group Therapy - Process   Participation Level:  Active  Participation Quality:  Attentive, Sharing and Supportive  Affect:  Blunted  Cognitive:  Alert, Appropriate and Oriented  Insight:  Engaged  Engagement in Therapy:  Engaged  Modes of Intervention:  Education, Motivational Interviewing  Ambrose MantleMareida Grossman-Orr, LCSW 10/19/2015, 4:21 PM

## 2015-10-19 NOTE — [facility] Suicide Risk Assessment (Signed)
[facility]   Nursing information obtained from:   chart, patient Demographic factors:    29 year old single male, lives with parents, on disability Current Mental Status:   see below  Loss Factors:   death of GF a few months ago, disability Historical Factors:   prior psychiatric admissions but not recently- has been diagnosed with bipolar disorder in the past  Risk Reduction Factors:   resilience, supportive family Total Time spent with patient: 45 minutes Principal Problem:  Suicidal Ideations Diagnosis:   Patient Active Problem List   Diagnosis Date Noted  . Schizoaffective disorder, depressive type (HCC) [F25.1] 10/18/2015  . Schizoaffective disorder, bipolar type (HCC) [F25.0] 10/18/2015  . Suicidal ideation [R45.851]      Continued Clinical Symptoms:  Alcohol Use Disorder Identification Test Final Score (AUDIT): 0 The "Alcohol Use Disorders Identification Test", Guidelines for Use in Primary Care, Second Edition.  World Science writerHealth Organization [facility](WHO). Score between 0-7:  no or low risk or alcohol related problems. Score between 8-15:  moderate risk of alcohol related problems. Score between 16-19:  high risk of alcohol related problems. Score 20 or above:  warrants further diagnostic evaluation for alcohol dependence and treatment.   CLINICAL FACTORS:  29 year old male, lives with parents, reports long history of mental illness , with several prior psychiatric medications, but none in several years. Reports he had a " bad day" recently where he felt increasingly depressed, and reported to therapist suicidal ideations with plan, resulting in admission. Has been dealing with stress and grief related to death of GF a few months ago. At this time denies significant depression at present or on days prior to admission, however, and stresses he thinks it was mostly a " bad day" than protracted depression. States he was not taking any medications  recently    Psychiatric Specialty Exam: Physical Exam  ROS  Blood pressure 139/61, pulse 131, temperature 97.7 F (36.5 C), temperature source Oral, resp. rate 18, height 6\' 2"  (1.88 m), weight 326 lb (147.873 kg), SpO2 99 %.Body mass index is 41.84 kg/(m^2).  See admit note MSE   COGNITIVE FEATURES THAT CONTRIBUTE TO RISK:  Loss of executive function    SUICIDE RISK:   Moderate:  Frequent suicidal ideation with limited intensity, and duration, some specificity in terms of plans, no associated intent, good self-control, limited dysphoria/symptomatology, some risk factors present, and identifiable protective factors, including available and accessible social support.  PLAN OF CARE: Patient will be admitted to inpatient psychiatric unit for stabilization and safety. Will provide and encourage milieu participation. Provide medication management and maked adjustments as needed.  Will follow daily.    Medical Decision Making:  Established Problem, Stable/Improving (1), Review of Psycho-Social Stressors (1), Review or order clinical lab tests (1) and Review of Medication Regimen & Side Effects (2)  I certify that inpatient services furnished can reasonably be expected to improve the patient's condition.   COBOS, FERNANDO 10/19/2015, 11:14 AM

## 2015-10-19 NOTE — H&P (Addendum)
Psychiatric Admission Assessment Adult  Patient Identification: [name] MRN:  [id number] Date of Evaluation:  10/19/2015 Chief Complaint:  " I told my therapist I was having suicidal thoughts " Principal Diagnosis:  Suicidal ideations Diagnosis:   Patient Active Problem List   Diagnosis Date Noted  . Schizoaffective disorder, depressive type ([place]) [F25.1] 10/18/2015  . Schizoaffective disorder, bipolar type ([place]) [F25.0] 10/18/2015  . Suicidal ideation [R45.851]    History of Present Illness::  29 year old male. Presents as cooperative but vague historian. States he has had increased "stress at home"  and has also had " a lot of feelings of grief " since his GF passed away 2 months ago. States " it also bothers me that her family is keeping me away".  States it is difficult for him to describe his emotions, states " I guess I am mostly sad", but has also been feeling angry, mad , " when people tell me what I am supposed to be feeling, and how to grieve".  States " I guess I just snapped that day and  I told my therapist I was suicidal and was going to burn my house down with me in it, after making sure nobody else was in there ".  Of note, stresses he feels this was " a bad day", and that he has not been feeling severely depressed on most days leading to admission. As noted below, denies any significant neuro-vegetative symptoms of depression.  Associated Signs/Symptoms: Depression Symptoms:  suicidal thoughts with specific plan, of note, denies symptoms of depression, denies anhedonia, denies changes in energy, appetite, sleep, describes good sense of self esteem. (Hypo) Manic Symptoms:   Denies  Anxiety Symptoms:  Denies anxiety symptoms Psychotic Symptoms:   Denies any history of psychosis - denies hallucinations. PTSD Symptoms: Denies  Total Time spent with patient:  45 minutes   Past Psychiatric History: prior admissions but not in several years. Last admission 5 years ago or  so.History of one prior suicide by overdosing on acetaminophen in 2009. Denies any history of psychosis. States he has been diagnosed with Bipolar Disorder in the past  And with Borderline Personality Disorder.At this time , however,  Denies any clear history of mania or hypomania.  Denies history of violence .  States he remembers being on Depakote ER for several years, but had stopped a few years ago. States that prior to admission he had not been on any psychiatric medications .     Risk to Self: Is patient at risk for suicide?: Yes Risk to Others:   Prior Inpatient Therapy:   Prior Outpatient Therapy:    Alcohol Screening: 1. How often do you have a drink containing alcohol?: Never 9. Have you or someone else been injured as a result of your drinking?: No 10. Has a relative or friend or a doctor or another health worker been concerned about your drinking or suggested you cut down?: No Alcohol Use Disorder Identification Test Final Score (AUDIT): 0 Brief Intervention: AUDIT score less than 7 or less-screening does not suggest unhealthy drinking-brief intervention not indicated Substance Abuse History in the last 12 months:   Denies any alcohol abuse, denies any drug abuse  Consequences of Substance Abuse:  denies  Previous Psychotropic Medications:  Remembers being on Depakote ER in the past - not recently. Of note, chart notes indicate Risperidone and [name] as prior medications, but  patient states he was not taking any psychiatric medications prior to admission. It appears that  ,  as per patient, IM antipsychotic was last given years ago/ not recently Psychological Evaluations - no Past Medical History:  Denies any medical illness, allergic to Metformin, Ibuprofen, does not smoke Past Medical History  Diagnosis Date  . Bipolar 1 disorder ([place])   . High cholesterol     Past Surgical History  Procedure Laterality Date  . Pilonidal cyst excision    . Ganglion cyst excision      Family History:  Parents alive , live together, in [place], has one sister and an adopted brother. Family Psychiatric  History:  States he thinks his mother may be bipolar, sister has mild MR . No suicides in family, one uncle is alcoholic . Social History: Single, no children,  lives with his parents , currently enrolled on  PSR program- [facility] [employer]. On disability- mother is payee. Got GED . Denies legal issues . States his GF passed away earlier this year.  History  Alcohol Use No     History  Drug Use No    Social History   Social History  . Marital Status: Single    Spouse Name: N/A  . Number of Children: N/A  . Years of Education: N/A   Social History Main Topics  . Smoking status: Never Smoker   . Smokeless tobacco: Never Used  . Alcohol Use: No  . Drug Use: No  . Sexual Activity: Not Asked   Other Topics Concern  . None   Social History Narrative   Additional Social History:  Allergies:   Allergies  Allergen Reactions  . Aspirin     Bleeding in urine  . Ibuprofen     "bleeding in urine"  . Metformin And Related Diarrhea    Leg muscles became weak  . Ritalin [Methylphenidate Hcl]     Suicidal thoughts  . Shellfish-Derived Products Hives   Lab Results:  Results for orders placed or performed during the hospital encounter of 10/17/15 (from the past 48 hour(s))  Comprehensive metabolic panel     Status: Abnormal   Collection Time: 10/17/15  3:00 PM  Result Value Ref Range   Sodium 136 135 - 145 mmol/L   Potassium 3.7 3.5 - 5.1 mmol/L   Chloride 103 101 - 111 mmol/L   CO2 25 22 - 32 mmol/L   Glucose, Bld 106 (H) 65 - 99 mg/dL   BUN 10 6 - 20 mg/dL   Creatinine, Ser 0.94 0.61 - 1.24 mg/dL   Calcium 9.5 8.9 - 10.3 mg/dL   Total Protein 7.6 6.5 - 8.1 g/dL   Albumin 4.3 3.5 - 5.0 g/dL   AST 23 15 - 41 U/L   ALT 25 17 - 63 U/L   Alkaline Phosphatase 62 38 - 126 U/L   Total Bilirubin 1.5 (H) 0.3 - 1.2 mg/dL   GFR calc non Af Amer >60 >60 mL/min    GFR calc Af Amer >60 >60 mL/min    Comment: (NOTE) The eGFR has been calculated using the CKD EPI equation. This calculation has not been validated in all clinical situations. eGFR's persistently <60 mL/min signify possible Chronic Kidney Disease.    Anion gap 8 5 - 15  Ethanol (ETOH)     Status: None   Collection Time: 10/17/15  3:00 PM  Result Value Ref Range   Alcohol, Ethyl (B) <5 <5 mg/dL    Comment:        LOWEST DETECTABLE LIMIT FOR SERUM ALCOHOL IS 5 mg/dL FOR MEDICAL PURPOSES ONLY  Salicylate level     Status: None   Collection Time: 10/17/15  3:00 PM  Result Value Ref Range   Salicylate Lvl <4.0 2.8 - 30.0 mg/dL  Acetaminophen level     Status: Abnormal   Collection Time: 10/17/15  3:00 PM  Result Value Ref Range   Acetaminophen (Tylenol), Serum <10 (L) 10 - 30 ug/mL    Comment:        THERAPEUTIC CONCENTRATIONS VARY SIGNIFICANTLY. A RANGE OF 10-30 ug/mL MAY BE AN EFFECTIVE CONCENTRATION FOR MANY PATIENTS. HOWEVER, SOME ARE BEST TREATED AT CONCENTRATIONS OUTSIDE THIS RANGE. ACETAMINOPHEN CONCENTRATIONS >150 ug/mL AT 4 HOURS AFTER INGESTION AND >50 ug/mL AT 12 HOURS AFTER INGESTION ARE OFTEN ASSOCIATED WITH TOXIC REACTIONS.   CBC     Status: None   Collection Time: 10/17/15  3:00 PM  Result Value Ref Range   WBC 10.5 4.0 - 10.5 K/uL   RBC 5.07 4.22 - 5.81 MIL/uL   Hemoglobin 13.7 13.0 - 17.0 g/dL   HCT 41.6 39.0 - 52.0 %   MCV 82.1 78.0 - 100.0 fL   MCH 27.0 26.0 - 34.0 pg   MCHC 32.9 30.0 - 36.0 g/dL   RDW 13.7 11.5 - 15.5 %   Platelets 346 150 - 400 K/uL  Urine rapid drug screen (hosp performed) (Not at [facility])     Status: None   Collection Time: 10/17/15  6:19 PM  Result Value Ref Range   Opiates NONE DETECTED NONE DETECTED   Cocaine NONE DETECTED NONE DETECTED   Benzodiazepines NONE DETECTED NONE DETECTED   Amphetamines NONE DETECTED NONE DETECTED   Tetrahydrocannabinol NONE DETECTED NONE DETECTED   Barbiturates NONE DETECTED NONE DETECTED     Comment:        DRUG SCREEN FOR MEDICAL PURPOSES ONLY.  IF CONFIRMATION IS NEEDED FOR ANY PURPOSE, NOTIFY LAB WITHIN 5 DAYS.        LOWEST DETECTABLE LIMITS FOR URINE DRUG SCREEN Drug Class       Cutoff (ng/mL) Amphetamine      1000 Barbiturate      200 Benzodiazepine   102 Tricyclics       725 Opiates          300 Cocaine          300 THC              50     Metabolic Disorder Labs:  No results found for: HGBA1C, MPG No results found for: PROLACTIN No results found for: CHOL, TRIG, HDL, CHOLHDL, VLDL, LDLCALC  Current Medications: Current Facility-Administered Medications  Medication Dose Route Frequency Provider Last Rate Last Dose  . acetaminophen (TYLENOL) tablet 650 mg  650 mg Oral Q6H PRN Patrecia Pour, NP      . alum & mag hydroxide-simeth (MAALOX/MYLANTA) 200-200-20 MG/5ML suspension 30 mL  30 mL Oral Q4H PRN Patrecia Pour, NP      . amantadine (SYMMETREL) capsule 100 mg  100 mg Oral BID Patrecia Pour, NP   100 mg at 10/19/15 0745  . atropine 1 % ophthalmic solution 1 drop  1 drop Both Eyes BID PC Patrecia Pour, NP      . glipiZIDE (GLUCOTROL) tablet 10 mg  10 mg Oral QAC breakfast Patrecia Pour, NP   10 mg at 10/19/15 3664  . magnesium hydroxide (MILK OF MAGNESIA) suspension 30 mL  30 mL Oral Daily PRN Patrecia Pour, NP      . Oxcarbazepine (TRILEPTAL) tablet 300  mg  300 mg Oral BID Patrecia Pour, NP   300 mg at 10/19/15 0745  . paliperidone ([name]) 24 hr tablet 6 mg  6 mg Oral Daily Patrecia Pour, NP   6 mg at 10/19/15 0745  . simvastatin (ZOCOR) tablet 10 mg  10 mg Oral QHS Patrecia Pour, NP   10 mg at 10/18/15 2240  . traZODone (DESYREL) tablet 100 mg  100 mg Oral QHS Patrecia Pour, NP   100 mg at 10/18/15 2240   PTA Medications: Prescriptions prior to admission  Medication Sig Dispense Refill Last Dose  . atropine 1 % ophthalmic solution      . divalproex (DEPAKOTE ER) 500 MG 24 hr tablet      . glipiZIDE (GLUCOTROL) 10 MG tablet    10/18/2015 at  Unknown time  . paliperidone ([name] SUSTENNA) 156 MG/ML SUSP injection      . promethazine (PHENERGAN) 25 MG tablet      . risperiDONE (RISPERDAL) 2 MG tablet      . risperiDONE microspheres (RISPERDAL CONSTA) 25 MG injection      . simvastatin (ZOCOR) 10 MG tablet Take 10 mg by mouth at bedtime.     Past Week at Unknown  . sitaGLIPtin (JANUVIA) 100 MG tablet        Musculoskeletal: Strength & Muscle Tone: within normal limits Gait & Station: normal Patient leans: N/A  Psychiatric Specialty Exam: Physical Exam  Review of Systems  Constitutional: Negative.   HENT: Negative.   Eyes: Negative.   Respiratory: Negative.   Cardiovascular: Negative.   Gastrointestinal: Negative.   Genitourinary: Negative.   Musculoskeletal: Negative.   Skin: Negative.   Neurological: Negative for seizures.  Endo/Heme/Allergies: Negative.   Psychiatric/Behavioral: Positive for depression and suicidal ideas.  All other systems reviewed and are negative.   Blood pressure 139/61, pulse 131, temperature 97.7 F (36.5 C), temperature source Oral, resp. rate 18, height 6' 2"  (1.88 m), weight 326 lb (147.873 kg), SpO2 99 %.Body mass index is 41.84 kg/(m^2).  General Appearance: Well Groomed  Engineer, water::  Good  Speech:  Normal Rate  Volume:  Normal  Mood:  states that today " I feel OK", not depressed   Affect:  mildly constricted  Thought Process:  Linear  Orientation:  Other:  fully alert and attentive   Thought Content:  denies hallucinations, no delusions   Suicidal Thoughts:  No denies any current suicidal ideations, denies any thoughts of hurting  Self or anyone else   Homicidal Thoughts:  No  Memory:  recent and remote grossly intact   Judgement:  Fair  Insight:  Fair  Psychomotor Activity:  Normal  Concentration:  Good  Recall:  Good  Fund of Knowledge:Good  Language: Good  Akathisia:  Negative  Handed:  Right  AIMS (if indicated):     Assets:  Desire for Improvement Physical  Health Resilience Social Support  ADL's:  Intact  Cognition: WNL  Sleep:  Number of Hours: 600     Treatment Plan Summary: Daily contact with patient to assess and evaluate symptoms and progress in treatment, Medication management, Plan inpatient admission and medications as below  Observation Level/Precautions:  15 minute checks  Laboratory:  As needed   Psychotherapy:  Milieu, groups   Medications:  Has been started on [name] and Trileptal- denies side effects.  As noted, patient states he has been stable for several years, and that he had not been on any psychiatric medications recently. Does have  a prior history of several psychiatric admissions in the past , and states he has been diagnosed with Bipolar Disorder- chart notes also indicate history of schizoaffective disorder . We agreed to continue [facility], which he states is helping. For now will D/C Trileptal , monitor on [name] trial  Consultations: As needed     Discharge Concerns:  -   Estimated LOS: 5-6 days   Other:     I certify that inpatient services furnished can reasonably be expected to improve the patient's condition.   COBOS, FERNANDO 11/19/201610:36 AM

## 2015-10-19 NOTE — Progress Notes (Signed)
D: Pt presents with flat affect and depressed mood. Pt have minimal interaction and forwards little information. Pt stated that he feels confused about his feelings and how he feels. Pt stated that he's grieving the loss of his girlfriend and just recently found out this week that she passed away a month ago. Pt rates fair sleep and appetite. Pt stated goal is to "try to get better". Pt compliant with taking meds and attending groups.  A: Medications administered as ordered per MD. Verbal support given. Pt encouraged to attend groups. 15 minute checks performed for safety.  R: Pt receptive to tx.

## 2015-10-19 NOTE — Progress Notes (Signed)
D: Pt is alert and oriented x4. Pt at the time of assessment denied any form of depression, anxiety, pain, SI, HI and AVH. He states, "I feel just fine." Pt is however isolative and withdrawn to self, flat affects with minimal interactions and brief eye contact. Pt remained calm and cooperative through the shift assessment.  A: Medications offered as prescribed.  Support, encouragement, and safe environment provided.  15-minute safety checks continue.  R: Pt was med compliant.  Pt did attend group. Safety checks continue.

## 2015-10-19 NOTE — Progress Notes (Signed)
Per Dr. Jama Flavorsobos, order benedryl 25 mg q6h for rash.

## 2015-10-19 NOTE — Progress Notes (Signed)
Adult Psychoeducational Group Note  Date:  10/19/2015 Time:  9:37 PM  Group Topic/Focus:  Wrap-Up Group:   The focus of this group is to help patients review their daily goal of treatment and discuss progress on daily workbooks.  Participation Level:  Active  Participation Quality:  Appropriate  Affect:  Appropriate  Cognitive:  Alert  Insight: Appropriate  Engagement in Group:  Engaged and Improving  Modes of Intervention:  Discussion  Additional Comments:    Flonnie HailstoneCOOKE, Divine Imber R 10/19/2015, 9:37 PM

## 2015-10-19 NOTE — Progress Notes (Signed)
If patient's rash worsens or patient has difficulty breathing, please send to ED for further treatment per Dr. Jama Flavorsobos.

## 2015-10-19 NOTE — Progress Notes (Signed)
Adult Psychoeducational Group Note  Date:  10/19/2015 Time: 1045  Group Topic/Focus:  Making Healthy Choices:   The focus of this group is to help patients identify negative/unhealthy choices they were using prior to admission and identify positive/healthier coping strategies to replace them upon discharge.  Participation Level:  Did Not Attend  Participation Quality:    Affect:    Cognitive:    Insight:   Engagement in Group:    Modes of Intervention:    Additional Comments:  In with MD.  Liborio NixonWhite, Landen Knoedler L 10/19/2015, 1:25 PM

## 2015-10-20 LAB — LIPID PANEL
CHOL/HDL RATIO: 4.4 ratio
CHOLESTEROL: 168 mg/dL (ref 0–200)
HDL: 38 mg/dL — AB (ref >40)
LDL CALC: 116 mg/dL — AB (ref 0–99)
TRIGLYCERIDES: 71 mg/dL (ref <150)
VLDL: 14 mg/dL (ref 0–40)

## 2015-10-20 LAB — TSH: TSH: 1.976 u[IU]/mL (ref 0.350–4.500)

## 2015-10-20 MED ORDER — ARIPIPRAZOLE 5 MG PO TABS
5.0000 mg | ORAL_TABLET | Freq: Every day | ORAL | Status: DC
  Filled 2015-10-20 (×3): qty 1

## 2015-10-20 MED ORDER — ARIPIPRAZOLE 5 MG PO TABS
5.0000 mg | ORAL_TABLET | Freq: Every day | ORAL | Status: DC
  Administered 2015-10-20 – 2015-10-22 (×3): 5 mg via ORAL
  Filled 2015-10-20 (×5): qty 1

## 2015-10-20 NOTE — [facility] Counselor (Signed)
Adult Comprehensive Assessment  Patient ID: [name], male   DOB: May 04, 1986, 29 y.o.   MRN: [id number]  Information Source:    Current Stressors:  Employment / Job issues: on Web designer / Lack of resources (include bankruptcy): on fixed income, mother is his payee Bereavement / Loss: girlfriend passed away a few months ago, recently found out  Living/Environment/Situation:  Living Arrangements: Parent Living conditions (as described by patient or guardian): Pt lives with his parents and sister in [place].  Pt reports this is a good relationship.  How long has patient lived in current situation?: his whole life What is atmosphere in current home: Loving, Supportive, Comfortable  Family History:  Marital status: Single Are you sexually active?: No What is your sexual orientation?: heterosexual Has your sexual activity been affected by drugs, alcohol, medication, or emotional stress?: none reported Does patient have children?: No  Childhood History:  By whom was/is the patient raised?: Both parents Additional childhood history information: pt reports having a good childhood.   Description of patient's relationship with caregiver when they were a child: pt reports getting along well with parents growing up.  Patient's description of current relationship with people who raised him/her: pt reports getting along well with parents today.  How were you disciplined when you got in trouble as a child/adolescent?: grounded or physically disciplined Does patient have siblings?: Yes Number of Siblings: 2 Description of patient's current relationship with siblings: pt reports having a decent relationship with brother and sister Did patient suffer any verbal/emotional/physical/sexual abuse as a child?: No Did patient suffer from severe childhood neglect?: No Has patient ever been sexually abused/assaulted/raped as an adolescent or adult?: No Was the patient ever a victim of a  crime or a disaster?: No Witnessed domestic violence?: No Has patient been effected by domestic violence as an adult?: No  Education:  Highest grade of school patient has completed: 8th grade, completed a work Investment banker, corporate and got his GED Currently a Consulting civil engineer?: No Learning disability?: Yes What learning problems does patient have?: pt doesn't know  Employment/Work Situation:   Employment situation: On disability Why is patient on disability: pt is unsure but assumes mental disability How long has patient been on disability: whole life Patient's job has been impacted by current illness: No What is the longest time patient has a held a job?: never worked Where was the patient employed at that time?: N/A Has patient ever been in the [employer]?: No Has patient ever served in Buyer, retail?: No Did You Receive Any Psychiatric Treatment/Services While in Equities trader?: No Are There Guns or Other Weapons in Your Home?: Yes Types of Guns/Weapons: pt reports having swords in his closet Are These Comptroller?: No Who Could Verify You Are Able To Have These Secured:: mother  Architect:   Surveyor, quantity resources: [employer], Medicaid, Support from parents / caregiver Does patient have a Lawyer or guardian?: Yes Name of representative payee or guardian: mother is his payee, reports he is his own guardian  Alcohol/Substance Abuse:   What has been your use of drugs/alcohol within the last 12 months?: pt denies If attempted suicide, did drugs/alcohol play a role in this?: No Alcohol/Substance Abuse Treatment Hx: Denies past history Has alcohol/substance abuse ever caused legal problems?: No  Social Support System:   Patient's Community Support System: Good Describe Community Support System: pt reports his family is his main support Type of faith/religion: Ephriam Knuckles How does patient's faith help to cope with current illness?: prayer,  church attendance  Leisure/Recreation:    Leisure and Hobbies: video games  Strengths/Needs:   What things does the patient do well?: being social In what areas does patient struggle / problems for patient: depression, SI  Discharge Plan:   Does patient have access to transportation?: Yes Will patient be returning to same living situation after discharge?: Yes Currently receiving community mental health services: Yes (From Whom) ([facility]) If no, would patient like referral for services when discharged?: Yes (What county?) [facility]([place]) Does patient have financial barriers related to discharge medications?: No  Summary/Recommendations:     Patient is a 29 year old African American Male with a diagnosis of Bipolar I disorder, Current or most recent episode depressed, Unspecified.  Patient lives in [place] with his parents and sister.  Pt reports coming to the hospital due to "grief issues".  Pt explains that he recently found out his girlfriend died a few months ago and is sad about this.  Pt states that he wanted to commit suicide by burning his house down with him in it but told his therapist first and was sent to the hospital before he could act on it.  Pt states that he is on disability and his mother is his payee, but reports he is his own guardian.  Pt states that [facility] but does not have a psychiatrist.  Pt has Medicaid (for referral purposes).  Pt reports having access to swords, which are kept in his closet.  Patient will benefit from crisis stabilization, medication evaluation, group therapy and psycho education in addition to case management for discharge planning. Discharge Process and Patient Expectations information sheet signed by patient, witnessed by writer and inserted in patient's shadow chart.    Pt denies being a smoker at this time so Sims Quitline N/A.   [name], [name]. 10/20/2015

## 2015-10-20 NOTE — Progress Notes (Signed)
Adult Psychoeducational Group Note  Date:  10/20/2015 Time:  8:25 PM  Group Topic/Focus:  Wrap-Up Group:   The focus of this group is to help patients review their daily goal of treatment and discuss progress on daily workbooks.  Participation Level:  Active  Participation Quality:  Attentive  Affect:  Appropriate  Cognitive:  Appropriate  Insight: Appropriate  Engagement in Group:  Engaged  Modes of Intervention:  Discussion  Additional Comments:  Pt reported that he had a good day, except for being "tricked out by his roommate". Pt reported that his goal for the day was "go home tomorrow". Pt reported that positive communication and interaction with other patients on the unit was the highlight of his day.   Cleotilde NeerJasmine S Simpson Paulos 10/20/2015, 8:59 PM

## 2015-10-20 NOTE — Progress Notes (Signed)
D: Pt presents anxious on approach. Pt appears animated and cautious during assessment. Pt denies SI/HI and depression. Pt reports fair sleep and appetite. Pt c/o of rash to his LT forearm, RT leg, and mid abdomen. Rash is reddened, no lesions noted. Rash do not appear to be spreading. Pt verbalized that rash appears the same today as it did yesterday. Pt denies pain or itching to rash sites. Writer offered pt benadryl for rash. Pt stated that the 50 mg of benadryl causes drowsiness. Pt refused benadryl. Pt compliant with attending groups and engaging with others on the milieu. A: Medications reviewed with pt. Verbal support given. Pt encouraged to attend groups. 15 minute checks performed for safety.  R: Pt receptive to tx.

## 2015-10-20 NOTE — Progress Notes (Signed)
Adult Psychoeducational Group Note  Date:  10/20/2015 Time:  1045  Group Topic/Focus:  Making Healthy Choices:   The focus of this group is to help patients identify negative/unhealthy choices they were using prior to admission and identify positive/healthier coping strategies to replace them upon discharge.  Participation Level:  Active  Participation Quality:  Appropriate  Affect:  Appropriate  Cognitive:  Appropriate  Insight: Appropriate  Engagement in Group:  Engaged  Modes of Intervention:  Discussion and Education  Additional Comments:    Runette Scifres L 10/20/2015, 1:29 PM

## 2015-10-20 NOTE — Progress Notes (Signed)
[facility]  10/20/2015 11:41 AM Alveda ReasonsMichael Till  MRN:  [id number] Subjective:  Today patient states he feels "OK". He states he is feeling better than he did prior to admission. Of note, rash, which had been noted on forearms, legs, abdomen, seems to be improving - denies any significant pruritus, denies pain, no mucosal involvement, no respiratory symptoms, no systemic symptoms noted or reported . States he had visit from family yesterday and that it went well, feels supported by family. Objective : I have discussed case with RN and reviewed chart notes, and have seen patient. Patient , as above, reports improved mood, and states that today he is "OK". He minimizes depression today and does not endorse any severe neuro-vegetative symptoms of depression. Staff reports that patient has been calm, cooperative, and visible in day room, going to groups, interacting appropriately with peers. He is now off psychiatric medications, as he developed rash and it appeared to be temporally related to [facility]. As noted, patient reports history of Schizoaffective Disorder, Mood instability, states that recently was experiencing depression, related partly to the death of his GF several months ago. We discussed medication options,  Not interested in Geodon or Risperidone as he states they have caused significant weight gain. He expresses interest in Ability trial. Rash areas inspected- less erythema, smaller area affected, certainly no worsening compared to yesterday Labs - TSH WNL, Lipid panel - marginally elevated LDL and slightly low HDL   Principal Problem: Suicidal ideation Diagnosis:   Patient Active Problem List   Diagnosis Date Noted  . Schizoaffective disorder, depressive type (HCC) [F25.1] 10/18/2015  . Schizoaffective disorder, bipolar type (HCC) [F25.0] 10/18/2015  . Suicidal ideation [R45.851]    Total Time spent with patient: 20 minutes    Past Medical History:  Past Medical  History  Diagnosis Date  . Bipolar 1 disorder (HCC)   . High cholesterol     Past Surgical History  Procedure Laterality Date  . Pilonidal cyst excision    . Ganglion cyst excision     Family History: History reviewed. No pertinent family history.  Social History:  History  Alcohol Use No     History  Drug Use No    Social History   Social History  . Marital Status: Single    Spouse Name: N/A  . Number of Children: N/A  . Years of Education: N/A   Social History Main Topics  . Smoking status: Never Smoker   . Smokeless tobacco: Never Used  . Alcohol Use: No  . Drug Use: No  . Sexual Activity: Not Asked   Other Topics Concern  . None   Social History Narrative   Additional Social History:   Sleep: Good  Appetite:  Good  Current Medications: Current Facility-Administered Medications  Medication Dose Route Frequency Provider Last Rate Last Dose  . albuterol (PROVENTIL HFA;VENTOLIN HFA) 108 (90 BASE) MCG/ACT inhaler 2 puff  2 puff Inhalation Q4H PRN Rockey SituFernando A Cobos, MD      . diphenhydrAMINE (BENADRYL) capsule 50 mg  50 mg Oral Q6H PRN Craige CottaFernando A Cobos, MD   50 mg at 10/19/15 1656  . magnesium hydroxide (MILK OF MAGNESIA) suspension 30 mL  30 mL Oral Daily PRN Charm RingsJamison Y Lord, NP        Lab Results:  Results for orders placed or performed during the hospital encounter of 10/18/15 (from the past 48 hour(s))  Lipid panel     Status: Abnormal   Collection Time: 10/20/15  6:42 AM  Result Value Ref Range   Cholesterol 168 0 - 200 mg/dL   Triglycerides 71 <782 mg/dL   HDL 38 (L) >95 mg/dL   Total CHOL/HDL Ratio 4.4 RATIO   VLDL 14 0 - 40 mg/dL   LDL Cholesterol 621 (H) 0 - 99 mg/dL    Comment:        Total Cholesterol/HDL:CHD Risk Coronary Heart Disease Risk Table                     Men   Women  1/2 Average Risk   3.4   3.3  Average Risk       5.0   4.4  2 X Average Risk   9.6   7.1  3 X Average Risk  23.4   11.0        Use the calculated Patient  Ratio above and the CHD Risk Table to determine the patient's CHD Risk.        ATP III CLASSIFICATION (LDL):  <100     mg/dL   Optimal  308-657  mg/dL   Near or Above                    Optimal  130-159  mg/dL   Borderline  846-962  mg/dL   High  >952     mg/dL   Very High Performed at [facility]   TSH     Status: None   Collection Time: 10/20/15  6:47 AM  Result Value Ref Range   TSH 1.976 0.350 - 4.500 uIU/mL    Comment: Performed at [facility]    Physical Findings: AIMS: Facial and Oral Movements Muscles of Facial Expression: None, normal Lips and Perioral Area: None, normal Jaw: None, normal Tongue: None, normal,Extremity Movements Upper (arms, wrists, hands, fingers): None, normal Lower (legs, knees, ankles, toes): None, normal, Trunk Movements Neck, shoulders, hips: None, normal, Overall Severity Severity of abnormal movements (highest score from questions above): None, normal Incapacitation due to abnormal movements: None, normal Patient's awareness of abnormal movements (rate only patient's report): No Awareness, Dental Status Current problems with teeth and/or dentures?: No Does patient usually wear dentures?: No  CIWA:    COWS:     Musculoskeletal: Strength & Muscle Tone: within normal limits Gait & Station: normal Patient leans: N/A  Psychiatric Specialty Exam: ROS denies any significant itching or pruritus, no fever, no chills, no nausea, no vomiting, rash improving   Blood pressure 110/66, pulse 99, temperature 97.6 F (36.4 C), temperature source Oral, resp. rate 16, height  (1.88 m), weight 326 lb (147.873 kg), SpO2 99 %.Body mass index is 41.84 kg/(m^2).  General Appearance: Fairly Groomed  Patent attorney::  Good  Speech:  Normal Rate  Volume:  Normal  Mood:  improved and denies depression today  Affect:  appropriate, smiles at times appropriately  Thought Process:  Linear  Orientation:  Full (Time, Place, and Person)   Thought Content:  denies hallucinations, no delusions, not internally preoccupied   Suicidal Thoughts:  No- today denies any suicidal ideations ,  Denies any self injurious ideations, denies any  Violent or homicidal ideations,  Homicidal Thoughts:  No  Memory:  recent and remote grossly intact   Judgement:  Fair  Insight:  Fair  Psychomotor Activity:  Normal  Concentration:  Good  Recall:  Good  Fund of Knowledge:Good  Language: Good  Akathisia:  Negative  Handed:  Right  AIMS (  if indicated):     Assets:  Desire for Improvement Resilience Social Support  ADL's:  Improved   Cognition: WNL  Sleep:  Number of Hours: 600  Assessment - at this time patient presents improved. Today minimizes depression, minimizes any ongoing neuro-vegetative symptoms of depression, and denies any SI. He also denies psychotic symptoms. He developed a rash yesterday due to which medications were D/Cd ( namely Experiment, which had been started recently) . Rash is now improving, without any evidence or indication of progression, no mucosal or respiratory compromise .  We discussed medication issues at length, because patient does have a documented history of mental illness and prior admissions ( has been diagnosed with Bipolar Spectrum Disorder /Schizoaffective Disorder in the past ) , we agreed to restart medication in spite of current improving symptoms. Patient agrees with Abilify trial Treatment Plan Summary: Daily contact with patient to assess and evaluate symptoms and progress in treatment, Medication management, Plan inpatient admission and medications as below  Encourage group /milieu participation to work on symptom reduction and ego strengths Start Abilify 5 mgrs daily, for mood disorder  Continue Benadryl 50 mgrs Q 6 hours PRN for anxiety, insomnia, or rash/pruritus Monitor rash to insure it is continuing to improve  COBOS, FERNANDO 10/20/2015, 11:41 AM

## 2015-10-20 NOTE — Progress Notes (Signed)
D: Patient observed visiting with family laughing and joking around. Pt has rash on legs, arms and a small  aea on abdomen. Denies any itching or discomfort states it is improving. Denies SI/HI/AVH. A:Suggested to patient the rash maybe coming form he soap or detergent. Patient stated he thinks it is coming from his meds and he is not taking anymore. Patient does contract for safety. R:Every 15 rounds continue to monitor for safety.

## 2015-10-20 NOTE — [facility] Group Notes (Signed)
[facility] Group Notes:  (Clinical Social Work)  10/20/2015  10:00-11:00AM  Summary of Progress/Problems:   The main focus of today's process group was to   1)  discuss the importance of adding supports  2)  define health supports versus unhealthy supports  3)  identify the patient's current unhealthy supports and plan how to handle them  4)  Identify the patient's current healthy supports and plan what to add.  An emphasis was placed on using counselor, doctor, therapy groups, 12-step groups, and problem-specific support groups to expand supports.    The patient expressed full comprehension of the concepts presented, and agreed that there is a need to add more supports.  The patient stated he has a good support system, but says that as a child he was considered to have Special Needs, so was sheltered by his parents who sheltered him also from the things that looking back, he thinks would have been helpful.  He stated that he has had to learn to be friendly to others.  He expressed that it is very difficult to trust doctors or therapists because a series of doctors had him on Depakote for many years from age 89yo to 29yo, with ever-increasing doses, and he eventually had very bad medical results.  He now refuses to be on any medication, he says.  He showed insight about some things that he shared with the group, and was commended by CSW for same.  He recognizes that he has to have some limits with family and friends.  Type of Therapy:  Process Group with Motivational Interviewing  Participation Level:  Active  Participation Quality:  Attentive, Sharing and Supportive  Affect:  Blunted  Cognitive:  Appropriate and Oriented  Insight:  Engaged  Engagement in Therapy:  Engaged  Modes of Intervention:   Education, Support and Processing, Activity  Ambrose MantleMareida Grossman-Orr, LCSW 10/20/2015

## 2015-10-20 NOTE — Plan of Care (Signed)
Problem: Alteration in mood Goal: LTG-Patient reports reduction in suicidal thoughts (Patient reports reduction in suicidal thoughts and is able to verbalize a safety plan for whenever patient is feeling suicidal)  Outcome: Progressing Denies SI/HI/AVH at this time.

## 2015-10-21 LAB — HEMOGLOBIN A1C
Hgb A1c MFr Bld: 6.1 % — ABNORMAL HIGH (ref 4.8–5.6)
MEAN PLASMA GLUCOSE: 128 mg/dL

## 2015-10-21 NOTE — [facility] Group Notes (Signed)
[facility] LCSW Group Therapy  10/21/2015 1:15pm  Type of Therapy:  Group Therapy vercoming Obstacles  Participation Level:  Active  Participation Quality:  Appropriate   Affect:  Appropriate  Cognitive:  Appropriate and Oriented  Insight:  Developing/Improving and Improving  Engagement in Therapy:  Improving  Modes of Intervention:  Discussion, Exploration, Problem-solving and Support  Description of Group:   In this group patients will be encouraged to explore what they see as obstacles to their own wellness and recovery. They will be guided to discuss their thoughts, feelings, and behaviors related to these obstacles. The group will process together ways to cope with barriers, with attention given to specific choices patients can make. Each patient will be challenged to identify changes they are motivated to make in order to overcome their obstacles. This group will be process-oriented, with patients participating in exploration of their own experiences as well as giving and receiving support and challenge from other group members.  Summary of Patient Progress: Pt active in group discussion, identifies trust issues as an obstacle for him as it makes it difficult to have positive support. Pt also expressed that grief was an obstacle as well because he feels that his family invalidates his grief which makes it hard for him to deal with it in a healthy way. Pt expressed a desire to have friends that do not label him and can empower him to overcome obstacles and be independent.   Therapeutic Modalities:   Cognitive Behavioral Therapy Solution Focused Therapy Motivational Interviewing Relapse Prevention Therapy   Chad CordialLauren [name], LCSWA 10/21/2015 4:00 PM

## 2015-10-21 NOTE — Plan of Care (Signed)
Problem: Diagnosis: Increased Risk For Suicide Attempt Goal: STG-Patient Will Attend All Groups On The Unit Outcome: Progressing Pt attends group with active participation.

## 2015-10-21 NOTE — Progress Notes (Signed)
D: Pt presents anxious on approach. Pt affect is bright. Pt denies depression. Pt denies suicidal thoughts. Pt confused about how to feel d/t the loss of his girlfriend. Pt stated that he's grieving but not depressed. Pt observed laughing and engaging with other pts on the unit. Pt reported fair sleep and appetite. Pt discharge plan is to continue with [facility] and find a psychiatrist to manage his meds. Pt rash to his Lt arm, RT leg and abd noted to be fading. No lesion to rash noted. Pt stated that he's tolerating Abilify well, no adverse reactions verbalized. A: Medications reviewed with pt. Verbal support given. Pt encouraged to attend groups. 15 minute checks performed for safety. R: Pt receptive to tx.

## 2015-10-21 NOTE — Progress Notes (Signed)
D: Pt presents appropriate in affect and mood. Pt is visible and active within the milieu. Pt reports readiness in regards to his upcoming discharge.Pt denies any SI/HI/AVH. A: Writer administered scheduled and prn medications to pt, per MD orders. Continued support and availability as needed was extended to this pt. Staff continues to monitor pt with q5115min checks.  R: No adverse drug reactions noted. Pt receptive to treatment. Pt remains safe at this time.

## 2015-10-21 NOTE — [facility] Group Notes (Signed)
[facility]  10/21/2015 8:45 AM  Participation Quality: Alert, Appropriate and Oriented  Mood/Affect: Flat  Depression Rating: 0  Anxiety Rating: 0  Thoughts of Suicide: Pt denies SI/HI  Will you contract for safety? Yes  Current AVH: Pt denies  Plan for Discharge/Comments: Pt attended discharge planning group and actively participated in group. CSW discussed suicide prevention education with the group and encouraged them to discuss discharge planning and any relevant barriers. Pt reports that he feels fine and that he has no needs. Pt wants to speak to MD about medication changes. He is involved at [facility] and they are working on getting medications.  Transportation Means: Pt reports access to transportation  Supports: No supports mentioned at this time  Chad CordialLauren [name], Theresia MajorsLCSWA 10/21/2015 9:33 AM

## 2015-10-21 NOTE — Progress Notes (Signed)
Adult Psychoeducational Group Note  Date:  10/21/2015 Time:  8:25 PM  Group Topic/Focus:  Wrap-Up Group:   The focus of this group is to help patients review their daily goal of treatment and discuss progress on daily workbooks.  Participation Level:  Active  Participation Quality:  Appropriate  Affect:  Appropriate  Cognitive:  Appropriate  Insight: Good  Engagement in Group:  Engaged  Modes of Intervention:  Discussion  Additional Comments:  Pt reported that he was feeling good, and rated his feeling a 10 out of 10. Pt reported that his goal for the day was to go home, which he says he will keep working on. Pt reported that playing spades with other patients on the unit was the highlight of his day.  Cleotilde NeerJasmine S Anyiah Coverdale 10/21/2015, 8:40 PM

## 2015-10-21 NOTE — Progress Notes (Signed)
[facility] MD Progress Note  10/21/2015  [name]  MRN:  [id number] Subjective:  "I'm doing pretty good today. I feel much better. I just really want to go home with my family soon."  Objective: Pt seen and chart reviewed. Pt is alert/oriented x4, calm, cooperative, and appropriate to situation. Pt denies suicidal/homicidal ideation and psychosis and does not appear to be responding to internal stimuli. Pt appears to be doing much better today in terms of mood stability and is optimistic about his treatment plan. Pt states that he would like to go home as soon as possible but is understanding that he may not go today. Pt cites good sleep and good appetite.   Principal Problem: Suicidal ideation Diagnosis:   Patient Active Problem List   Diagnosis Date Noted  . Schizoaffective disorder, depressive type (HCC) [F25.1] 10/18/2015  . Schizoaffective disorder, bipolar type (HCC) [F25.0] 10/18/2015  . Suicidal ideation [R45.851]    Total Time spent with patient: 15 minutes  Past Medical History:  Past Medical History  Diagnosis Date  . Bipolar 1 disorder (HCC)   . High cholesterol     Past Surgical History  Procedure Laterality Date  . Pilonidal cyst excision    . Ganglion cyst excision     Family History: History reviewed. No pertinent family history.  Social History:  History  Alcohol Use No     History  Drug Use No    Social History   Social History  . Marital Status: Single    Spouse Name: N/A  . Number of Children: N/A  . Years of Education: N/A   Social History Main Topics  . Smoking status: Never Smoker   . Smokeless tobacco: Never Used  . Alcohol Use: No  . Drug Use: No  . Sexual Activity: Not Asked   Other Topics Concern  . None   Social History Narrative   Additional Social History:   Sleep: Good  Appetite:  Good  Current Medications: Current Facility-Administered Medications  Medication Dose Route Frequency Provider Last Rate Last Dose  .  albuterol (PROVENTIL HFA;VENTOLIN HFA) 108 (90 BASE) MCG/ACT inhaler 2 puff  2 puff Inhalation Q4H PRN Rockey Situ Cobos, MD      . ARIPiprazole (ABILIFY) tablet 5 mg  5 mg Oral QHS Rockey Situ Cobos, MD   5 mg at 10/20/15 2244  . diphenhydrAMINE (BENADRYL) capsule 50 mg  50 mg Oral Q6H PRN Craige Cotta, MD   50 mg at 10/20/15 2246  . magnesium hydroxide (MILK OF MAGNESIA) suspension 30 mL  30 mL Oral Daily PRN Charm Rings, NP        Lab Results:  Results for orders placed or performed during the hospital encounter of 10/18/15 (from the past 48 hour(s))  Lipid panel     Status: Abnormal   Collection Time: 10/20/15  6:42 AM  Result Value Ref Range   Cholesterol 168 0 - 200 mg/dL   Triglycerides 71 <782 mg/dL   HDL 38 (L) >95 mg/dL   Total CHOL/HDL Ratio 4.4 RATIO   VLDL 14 0 - 40 mg/dL   LDL Cholesterol 621 (H) 0 - 99 mg/dL    Comment:        Total Cholesterol/HDL:CHD Risk Coronary Heart Disease Risk Table                     Men   Women  1/2 Average Risk   3.4   3.3  Average Risk  5.0   4.4  2 X Average Risk   9.6   7.1  3 X Average Risk  23.4   11.0        Use the calculated Patient Ratio above and the CHD Risk Table to determine the patient's CHD Risk.        ATP III CLASSIFICATION (LDL):  <100     mg/dL   Optimal  811-914  mg/dL   Near or Above                    Optimal  130-159  mg/dL   Borderline  782-956  mg/dL   High  >213     mg/dL   Very High Performed at [facility]   Hemoglobin A1c     Status: Abnormal   Collection Time: 10/20/15  6:47 AM  Result Value Ref Range   Hgb A1c MFr Bld 6.1 (H) 4.8 - 5.6 %    Comment: (NOTE)         Pre-diabetes: 5.7 - 6.4         Diabetes: >6.4         Glycemic control for adults with diabetes: <7.0    Mean Plasma Glucose 128 mg/dL    Comment: (NOTE) Performed At: [facility] [address] [place], [place] [id number] [name] GE:[id number] Performed at [facility]    TSH     Status: None   Collection Time: 10/20/15  6:47 AM  Result Value Ref Range   TSH 1.976 0.350 - 4.500 uIU/mL    Comment: Performed at [facility]    Physical Findings: AIMS: Facial and Oral Movements Muscles of Facial Expression: None, normal Lips and Perioral Area: None, normal Jaw: None, normal Tongue: None, normal,Extremity Movements Upper (arms, wrists, hands, fingers): None, normal Lower (legs, knees, ankles, toes): None, normal, Trunk Movements Neck, shoulders, hips: None, normal, Overall Severity Severity of abnormal movements (highest score from questions above): None, normal Incapacitation due to abnormal movements: None, normal Patient's awareness of abnormal movements (rate only patient's report): No Awareness, Dental Status Current problems with teeth and/or dentures?: No Does patient usually wear dentures?: No  CIWA:    COWS:     Musculoskeletal: Strength & Muscle Tone: within normal limits Gait & Station: normal Patient leans: N/A  Psychiatric Specialty Exam: Review of Systems  Psychiatric/Behavioral: Positive for depression. Negative for suicidal ideas, hallucinations and substance abuse. The patient is nervous/anxious. The patient does not have insomnia.   All other systems reviewed and are negative.  denies any significant itching or pruritus, no fever, no chills, no nausea, no vomiting, rash improving   Blood pressure 153/89, pulse 84, temperature 97.8 F (36.6 C), temperature source Oral, resp. rate 16, height  (1.88 m), weight 147.873 kg (326 lb), SpO2 99 %.Body mass index is 41.84 kg/(m^2).  General Appearance: Fairly Groomed  Patent attorney::  Good  Speech:  Normal Rate  Volume:  Normal  Mood:  Depressed and although improving  Affect:  Appropriate and Congruent  Thought Process:  Linear  Orientation:  Full (Time, Place, and Person)  Thought Content:  denies hallucinations, no delusions, not internally preoccupied    Suicidal Thoughts:  No- today denies any suicidal ideations ,  Denies any self injurious ideations, denies any  Violent or homicidal ideations,  Homicidal Thoughts:  No  Memory:  recent and remote grossly intact   Judgement:  Fair  Insight:  Fair  Psychomotor Activity:  Normal  Concentration:  Good  Recall:  Good  Fund of Knowledge:Good  Language: Good  Akathisia:  Negative  Handed:  Right  AIMS (if indicated):     Assets:  Desire for Improvement Resilience Social Support  ADL's:  Improved   Cognition: WNL  Sleep:  Number of Hours: 5.25   *On 10/21/15, I have reviewed and concur with treatment plan below, modified as follows: No changes today as plan is working very well   Treatment Plan Summary: Daily contact with patient to assess and evaluate symptoms and progress in treatment, Medication management, Plan inpatient admission and medications as below  Encourage group /milieu participation to work on symptom reduction and ego strengths Start Abilify 5 mgrs daily, for mood disorder  Continue Benadryl 50 mgrs Q 6 hours PRN for anxiety, insomnia, or rash/pruritus Monitor rash to insure it is continuing to improve; improving gradually -A1C is 6.1 (H), will continue to monitor  Beau FannyWithrow, John C, FNP-BC 10/21/2015, 09:28 AM

## 2015-10-22 NOTE — Progress Notes (Signed)
Adult Psychoeducational Group Note  Date:  10/22/2015 Time:  9:38 PM  Group Topic/Focus:  Wrap-Up Group:   The focus of this group is to help patients review their daily goal of treatment and discuss progress on daily workbooks.  Participation Level:  Active  Participation Quality:  Appropriate  Affect:  Appropriate  Cognitive:  Alert  Insight: Appropriate  Engagement in Group:  Engaged  Modes of Intervention:  Discussion  Additional Comments:  Patient stated that his day started off good. Patient stated a positive thing that happened today  was him learning restraint. Patient stated his goal for today was to work on his discharge plan for tomorrow.   Elmina Hendel L Bayler Gehrig 10/22/2015, 9:38 PM

## 2015-10-22 NOTE — [facility] Group Notes (Signed)
[facility] Group Notes:  (Nursing/MHT/Case Management/Adjunct)  Date:  10/22/2015  Time:  0930  Type of Therapy:  Nurse Education  Participation Level:  Active  Participation Quality:  Appropriate and Attentive  Affect:  Appropriate  Cognitive:  Appropriate  Insight:  Good  Engagement in Group:  Engaged  Modes of Intervention:  Activity, Clarification, Confrontation, Discussion and Support  Summary of Progress/Problems:  Dara Hoyershley N Rekia Kujala 10/22/2015, 5:39 PM

## 2015-10-22 NOTE — Progress Notes (Signed)
Patient ID: [name], male   DOB: January 21, 1986, 29 y.o.   MRN: [id number] [facility]  10/22/2015 1:19 PM [name]  MRN:  [id number] Subjective:  Patient reports he is feeling better , and at this time minimizes severe depression. States he is still saddened about death of GF, but no longer feeling severely depressed. Denies any suicidal ideations, and denies changes in sleep or appetite at this time. Currently denies medication side effects on Abilify. Of note, patient's rash has improved, and he denies any current pruritus, pain or discomfort . Patient hoping for discharge soon, as he wants to spend Thanksgiving holiday with family . Objective : I have discussed case with staff,  reviewed chart notes, and have seen patient. Patient presenting with improved mood and fuller range of affect. Visible in day room, active in milieu, going to groups, no disruptive behaviors. Noted by staff to be socializing appropriately with peers. With patient's express consent I have spoken with his mother via phone- mother has been visiting patient regularly and corroborates that patient seems much improved. She is agreeing with him being discharged back home soon, as he continues to stabilize. Of note, mother states that patient's sibling is also on Abilify and does well on this medication. Patient presenting with a full range of affect, and minimizing depression or sadness at this time . Denies SI.   Principal Problem: Suicidal ideation Diagnosis:   Patient Active Problem List   Diagnosis Date Noted  . Schizoaffective disorder, depressive type (HCC) [F25.1] 10/18/2015  . Schizoaffective disorder, bipolar type (HCC) [F25.0] 10/18/2015  . Suicidal ideation [R45.851]    Total Time spent with patient: 20 minutes    Past Medical History:  Past Medical History  Diagnosis Date  . Bipolar 1 disorder (HCC)   . High cholesterol     Past Surgical History  Procedure Laterality Date  .  Pilonidal cyst excision    . Ganglion cyst excision     Family History: History reviewed. No pertinent family history.  Social History:  History  Alcohol Use No     History  Drug Use No    Social History   Social History  . Marital Status: Single    Spouse Name: N/A  . Number of Children: N/A  . Years of Education: N/A   Social History Main Topics  . Smoking status: Never Smoker   . Smokeless tobacco: Never Used  . Alcohol Use: No  . Drug Use: No  . Sexual Activity: Not Asked   Other Topics Concern  . None   Social History Narrative   Additional Social History:   Sleep: Good  Appetite:  Good  Current Medications: Current Facility-Administered Medications  Medication Dose Route Frequency Provider Last Rate Last Dose  . albuterol (PROVENTIL HFA;VENTOLIN HFA) 108 (90 BASE) MCG/ACT inhaler 2 puff  2 puff Inhalation Q4H PRN Rockey SituFernando A Cobos, MD      . ARIPiprazole (ABILIFY) tablet 5 mg  5 mg Oral QHS Rockey SituFernando A Cobos, MD   5 mg at 10/21/15 2252  . diphenhydrAMINE (BENADRYL) capsule 50 mg  50 mg Oral Q6H PRN Craige CottaFernando A Cobos, MD   50 mg at 10/20/15 2246  . magnesium hydroxide (MILK OF MAGNESIA) suspension 30 mL  30 mL Oral Daily PRN Charm RingsJamison Y Lord, NP        Lab Results:  No results found for this or any previous visit (from the past 48 hour(s)).  Physical Findings: AIMS: Facial and Oral Movements  Muscles of Facial Expression: None, normal Lips and Perioral Area: None, normal Jaw: None, normal Tongue: None, normal,Extremity Movements Upper (arms, wrists, hands, fingers): None, normal Lower (legs, knees, ankles, toes): None, normal, Trunk Movements Neck, shoulders, hips: None, normal, Overall Severity Severity of abnormal movements (highest score from questions above): None, normal Incapacitation due to abnormal movements: None, normal Patient's awareness of abnormal movements (rate only patient's report): No Awareness, Dental Status Current problems with teeth  and/or dentures?: No Does patient usually wear dentures?: No  CIWA:    COWS:     Musculoskeletal: Strength & Muscle Tone: within normal limits Gait & Station: normal Patient leans: N/A  Psychiatric Specialty Exam: ROS denies any significant itching or pruritus, no fever, no chills, no nausea, no vomiting, rash  Continues to improve and areas of erythema as now much less noticeable .  Blood pressure 122/73, pulse 89, temperature 97.9 F (36.6 C), temperature source Oral, resp. rate 18, height  (1.88 m), weight 326 lb (147.873 kg), SpO2 99 %.Body mass index is 41.84 kg/(m^2).  General Appearance:  Well groomed  Eye Contact::  Good  Speech:  Normal Rate  Volume:  Normal  Mood:  Denies significant depression, mood presents improved   Affect:  appropriate, smiles at times appropriately  Thought Process:  Linear  Orientation:  Full (Time, Place, and Person)  Thought Content:  denies hallucinations, no delusions, not internally preoccupied   Suicidal Thoughts:  No- today denies any suicidal ideations ,  Denies any self injurious ideations, denies any  Violent or homicidal ideations,  Homicidal Thoughts:  No  Memory:  recent and remote grossly intact   Judgement:  Fair  Insight:  Fair  Psychomotor Activity:  Normal  Concentration:  Good  Recall:  Good  Fund of Knowledge:Good  Language: Good  Akathisia:  Negative  Handed:  Right  AIMS (if indicated):     Assets:  Desire for Improvement Resilience Social Support  ADL's:  Improved   Cognition: WNL  Sleep:  Number of Hours: 6.25  Assessment - patient continues to improve, and currently presents with a fuller range of affect. Mother who has been visiting patient regularly corroborates patient is improved and closer to baseline, and is agreeing with patient being discharged soon. At this time tolerating Abilify well, and rash he had developed initially has been resolving . No indication of allergic reaction to Abilify at this time.  Patient interactive with peers and pleasant , calm on unit . Treatment Plan Summary: Daily contact with patient to assess and evaluate symptoms and progress in treatment, Medication management, Plan inpatient admission and medications as below  Encourage group /milieu participation to work on symptom reduction and ego strengths Continue Abilify 5 mgrs daily, for mood disorder  Continue Benadryl 50 mgrs Q 6 hours PRN for anxiety, insomnia, or rash/pruritus Consider discharge soon as he continues to improve- treatment team working on disposition planning  COBOS, FERNANDO 10/22/2015, 1:19 PM

## 2015-10-22 NOTE — Progress Notes (Signed)
D:Per pt self inventory form pt reports he slept good last night. He reports a good appetite, normal energy level, good concentration. He rates depression 0/10, hopelessness 0/10, anxiety 0/10- all on 0-10 scale, 10 being the worse. Pt denies SI/HI. Denies AVH. Pt reports his goal for the day is "to go home" Pt reports rash is not getting better. He denies itching,pain,, discomfort associated with rash.  A:Special checks q 15 mins in place for safety. Medication administered per MD order (See eMAR) Encouragement and support provided. MD notified of rash.  R:Safety maintained. Compliant with medication regimen. Will continue to monitor.

## 2015-10-22 NOTE — [facility] Group Notes (Signed)
[facility] LCSW Group Therapy 10/22/2015 1:15 PM  Type of Therapy: Group Therapy- Feelings about Diagnosis  Participation Level: Active, Monopolizing  Participation Quality:  Appropriate  Affect:  Appropriate  Cognitive: Alert and Oriented   Insight:  Developing   Engagement in Therapy: Developing/Improving and Engaged   Modes of Intervention: Clarification, Confrontation, Discussion, Education, Exploration, Limit-setting, Orientation, Problem-solving, Rapport Building, Dance movement psychotherapisteality Testing, Socialization and Support  Description of Group:   This group will allow patients to explore their thoughts and feelings about diagnoses they have received. Patients will be guided to explore their level of understanding and acceptance of these diagnoses. Facilitator will encourage patients to process their thoughts and feelings about the reactions of others to their diagnosis, and will guide patients in identifying ways to discuss their diagnosis with significant others in their lives. This group will be process-oriented, with patients participating in exploration of their own experiences as well as giving and receiving support and challenge from other group members.  Summary of Progress/Problems:  Pt participates in group discussion, monopolizing at times. Pt rigid in his opinions regarding treatment. Pt insists that he knows what is best for himself, especially if "professionals" disagree. Pt expresses a desire to have better support in the community with others who suffer from similar conditions. Pt discussed the frustration he feels as others either "baby" him or belittle him because of his mental illness.  Therapeutic Modalities:   Cognitive Behavioral Therapy Solution Focused Therapy Motivational Interviewing Relapse Prevention Therapy  Chad CordialLauren [name], LCSWA 10/22/2015 4:35 PM

## 2015-10-22 NOTE — Progress Notes (Signed)
Recreation Therapy Notes  Animal-Assisted Activity (AAA) Program Checklist/Progress Notes Patient Eligibility Criteria Checklist & Daily Group note for Rec Tx Intervention  Date: 11.22.2016 Time: 2:45pm [address]   AAA/T Program Assumption of Risk Form signed by Patient/ or Parent Legal Guardian yes  Patient is free of allergies or sever asthma yes  Patient reports no fear of animals yes  Patient reports no history of cruelty to animals yes  Patient understands his/her participation is voluntary yes  Patient washes hands before animal contact yes  Patient washes hands after animal contact yes  Behavioral Response: Appropriate  Education: Hand Washing, Appropriate Animal Interaction   Education Outcome: Acknowledges education.   Clinical Observations/Feedback: Patient engaged appropriately with peers and therapy dog during session.   Marykay Lexenise L Amity Roes, LRT/CTRS  Jearl KlinefelterBlanchfield, Oveda Dadamo L 10/22/2015 3:23 PM

## 2015-10-22 NOTE — [facility] Suicide Risk Assessment (Signed)
[facility] INPATIENT:  Family/Significant Other Suicide Prevention Education  Suicide Prevention Education:  Education Completed; [name], Pt's mother [phone], has been identified by the patient as the family member/significant other with whom the patient will be residing, and identified as the person(s) who will aid the patient in the event of a mental health crisis (suicidal ideations/suicide attempt).  With written consent from the patient, the family member/significant other has been provided the following suicide prevention education, prior to the and/or following the discharge of the patient.  The suicide prevention education provided includes the following:  Suicide risk factors  Suicide prevention and interventions  National Suicide Hotline telephone number  [facility] assessment telephone number  [facility] Emergency Assistance 911  [facility] and/or Residential Mobile Crisis Unit telephone number  Request made of family/significant other to:  Remove weapons (e.g., guns, rifles, knives), all items previously/currently identified as safety concern.    Remove drugs/medications (over-the-counter, prescriptions, illicit drugs), all items previously/currently identified as a safety concern.  The family member/significant other verbalizes understanding of the suicide prevention education information provided.  The family member/significant other agrees to remove the items of safety concern listed above.  Brian HoopsCarter, Jonella Redditt M 10/22/2015, 3:32 PM

## 2015-10-22 NOTE — Progress Notes (Signed)
D: Pt observed playing cards with his peers in the dayroom. Pt expressed the desire to be discharged before thanksgiving. Pt [facility]. Pt denied any SI/HI/AVH. No spreading of rash noted. Pt remains asymptomatic.  A: Writer administered scheduled medications to pt, per MD orders. Continued support and availability as needed was extended to this pt. Staff continues to monitor pt with q2815min checks.  R: No adverse drug reactions noted. Pt receptive to treatment. Pt remains safe at this time.

## 2015-10-23 MED ORDER — ALBUTEROL SULFATE HFA 108 (90 BASE) MCG/ACT IN AERS: 2.0000 | INHALATION_SPRAY | RESPIRATORY_TRACT | Status: DC | PRN

## 2015-10-23 MED ORDER — ARIPIPRAZOLE 5 MG PO TABS: 5.0000 mg | ORAL_TABLET | Freq: Every day | ORAL | Status: DC

## 2015-10-23 NOTE — Progress Notes (Signed)
Pt has been visible in the milieu, congregating with his peers.  Pt and his peers have needed to be redirected a number of times for being loud and boisterous in the dayroom and the hallway.  Pt was as also observed being argumentative with his sister during their visit.  When asked, pt said that his sister said something to him that made him mad, but that he was not wanting to hurt her.  He denies SI/HI/AVH at this time.  He feels he is ready to go home, and expects to be discharged tomorrow to be home in time for Thanksgiving.  He voices no needs or concerns at this time.  Support and encouragement offered.  Safety maintained with q15 minute checks.
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
## 2015-10-23 NOTE — BHH Suicide Risk Assessment (Addendum)
[facility] Discharge Suicide Risk Assessment   Demographic Factors:  29 year old male, single, on disability, lives with parents   Total Time spent with patient: 30 minutes  Musculoskeletal: Strength & Muscle Tone: within normal limits Gait & Station: normal Patient leans: N/A  Psychiatric Specialty Exam: Physical Exam  ROS  Blood pressure 129/83, pulse 113, temperature 97.9 F (36.6 C), temperature source Oral, resp. rate 18, height  (1.88 m), weight 326 lb (147.873 kg), SpO2 99 %.Body mass index is 41.84 kg/(m^2).  General Appearance: Fairly Groomed  Patent attorney::  Good  Speech:  Normal Rate409  Volume:  Normal  Mood:  Euthymic denies any depression, states mood is " pretty good "  Affect:  Appropriate  Thought Process:  Goal Directed and Linear  Orientation:  Other:  fully alert and attentive   Thought Content:  no hallucinations, no delusions  Suicidal Thoughts:  No denies any self injurious or suicidal ideations   Homicidal Thoughts:  No denies any violent or homicidal ideations  Memory:  recent and remote grossly intact   Judgement:  Other:  improved   Insight:  Fair  Psychomotor Activity:  Normal  Concentration:  Good  Recall:  Good  Fund of Knowledge:Good  Language: Good  Akathisia:  Negative  Handed:  Right  AIMS (if indicated):     Assets:  Desire for Improvement Housing Resilience Social Support  Sleep:  Number of Hours: 6.25  Cognition: WNL  ADL's:  Intact   Have you used any form of tobacco in the last 30 days? (Cigarettes, Smokeless Tobacco, Cigars, and/or Pipes): No  Has this patient used any form of tobacco in the last 30 days? (Cigarettes, Smokeless Tobacco, Cigars, and/or Pipes) No  Mental Status Per Nursing Assessment::   On Admission:     Current Mental Status by Physician: At this time patient is improved compared to admission- currently denies any ongoing depression, and states his mood is normal, his affect is more reactive, no thought  disorder, denies any suicidal or homicidal ideations  And there are  No psychotic symptoms. He is future oriented, for example, stating he is looking forward to help his mother cook thanksgiving meal .  Loss Factors: Disability, death of GF about three months ago.   Historical Factors: Has been diagnosed with Schizo- Affective Disorder , prior psychiatric admissions, but not in several years, history of suicide attempt in the past   Risk Reduction Factors:   Sense of responsibility to family, Living with another person, especially a relative, Positive social support and Positive coping skills or problem solving skills  Continued Clinical Symptoms:  As noted, currently improved- denying depression, presenting with improved range of affect , no SI or HI, no psychotic symptoms, future oriented . Denies medication side effects at this time Rash now much improved .  Cognitive Features That Contribute To Risk:  No gross cognitive deficits noted upon discharge. Is alert , attentive, and oriented x 3   Suicide Risk:  Mild:  Suicidal ideation of limited frequency, intensity, duration, and specificity.  There are no identifiable plans, no associated intent, mild dysphoria and related symptoms, good self-control (both objective and subjective assessment), few other risk factors, and identifiable protective factors, including available and accessible social support.  Principal Problem: Suicidal ideation Discharge Diagnoses:  Patient Active Problem List   Diagnosis Date Noted  . Schizoaffective disorder, depressive type (HCC) [F25.1] 10/18/2015  . Schizoaffective disorder, bipolar type (HCC) [F25.0] 10/18/2015  . Suicidal ideation [R45.851]  Follow-up Information    Follow up with [facility].   Why:  resume PSR program on Monday for therapy and day treatment. Your therapist is making a referral for medication [facility]   Contact information:   [address],  [place], [place] [address] Phone: [phone]       Plan Of Care/Follow-up recommendations:  Activity:  as tolerated Diet:  Regular Tests:  NA Other:  See below   Is patient on multiple antipsychotic therapies at discharge:  No   Has Patient had three or more failed trials of antipsychotic monotherapy by history:  No  Recommended Plan for Multiple Antipsychotic Therapies: NA  Patient is leaving unit in good spirits. Plans to return to live with parents. Follow up as above   COBOS, FERNANDO 10/23/2015, 10:37 AM

## 2015-10-23 NOTE — Tx Team (Signed)
Interdisciplinary Treatment Plan Update (Adult) Date: 10/23/2015   Date: 10/23/2015 9:12 AM  Progress in Treatment:  Attending groups: Yes  Participating in groups: Yes  Taking medication as prescribed: Yes  Tolerating medication: Yes  Family/Significant othe contact made: Yes, with mother Patient understands diagnosis: Yes Discussing patient identified problems/goals with staff: Yes  Medical problems stabilized or resolved: Yes  Denies suicidal/homicidal ideation: Yes Patient has not harmed self or Others: Yes   New problem(s) identified: None identified at this time.   Discharge Plan or Barriers: Pt will discharge home and follow-up with [facility]  Additional comments: n/a   Reason for Continuation of Hospitalization:  Depression Medication stabilization Suicidal ideation  Estimated length of stay: 0 days; Pt stable for DC  Review of initial/current patient goals per problem list:   1.  Goal(s): Patient will participate in aftercare plan  Met:  Yes  Target date: 3-5 days from date of admission   As evidenced by: Patient will participate within aftercare plan AEB aftercare provider and housing plan at discharge being identified.   10/23/15: Pt discharging home and will follow-up with [facility] for therapy.  2.  Goal (s): Patient will exhibit decreased depressive symptoms and suicidal ideations.  Met:  Yes  Target date: 3-5 days from date of admission   As evidenced by: Patient will utilize self rating of depression at 3 or below and demonstrate decreased signs of depression or be deemed stable for discharge by MD.  10/23/15: Pt rates depression at 0/10; denies SI.  Attendees:  Patient:    Family:    Physician: Dr. Parke Poisson, MD  10/23/2015 9:12 AM  Nursing: Lars Pinks, RN Case manager  10/23/2015 9:12 AM  Clinical Social Worker Peri Maris, [place] 10/23/2015 9:12 AM  Other: Tilden Fossa, LCSWA 10/23/2015 9:12 AM  Clinical: Festus Aloe, RN 10/23/2015 9:12 AM  Other: , RN Charge Nurse 10/23/2015 9:12 AM  Other:     Peri Maris, [place] Social Work [phone]

## 2015-10-23 NOTE — Progress Notes (Signed)
Discharge note: Pt discharged per MD order. Discharge summary reviewed with pt. Pt verbalizes and signs understanding of discharge. Pt denies SI/HI. Denies AVH. RX given. At discharge Pt verbalizes a bag of clothes located in his room including 2-3 fruit of the loom t-shirts and 2-3 man underwear are missing from his room. Pt reports the value of these items is $7-10$. Environmental services contacted, unit searched, unable to find items pt reports he has lost. Julieanne Cottonina AC notified of event. All personal items returned to pt from locker #40 in search room. Pt verbalizes and signs he received all personal items. Pt ambulatory out of facility. Pt father in lobby for discharge.

## 2015-10-23 NOTE — Progress Notes (Signed)
  [facility] Discharge [facility] :  Will you be returning to the same living situation after discharge:  Yes,  Pt returning home with family At discharge, do you have transportation home?: Yes,  Pt's mother to provide transportation Do you have the ability to pay for your medications: Yes,  Pt provided with prescriptions  Release of information consent forms completed and in the chart;  Patient's signature needed at discharge.  Patient to Follow up at: Follow-up Information    Follow up with [facility].   Why:  resume PSR program on Monday for therapy and day treatment. Your therapist is making a referral for [facility]   Contact information:   [address],  [place], [place] [address] Phone: [phone]       Next level of care provider [facility] Link:no  Patient denies SI/HI: Yes,  Pt denies    Safety Planning and Suicide Prevention discussed: Yes,  with mother; see SPE note for further details  Have you used any form of tobacco in the last 30 days? (Cigarettes, Smokeless Tobacco, Cigars, and/or Pipes): No  Has patient been referred to the Quitline?: N/A patient is not a smoker  [name], [name] M 10/23/2015, 9:20 AM

## 2015-10-23 NOTE — BHH Group Notes (Signed)
[facility] Note  10/23/2015 8:45 AM  Participation Quality: Alert, Appropriate and Oriented  Mood/Affect: Appropriate  Depression Rating: 0  Anxiety Rating: 0  Thoughts of Suicide: Pt denies SI/HI  Will you contract for safety? Yes  Current AVH: Pt denies  Plan for Discharge/Comments: Pt attended discharge planning group and actively participated in group. CSW discussed suicide prevention education with the group and encouraged them to discuss discharge planning and any relevant barriers. Pt expresses that he is ready to DC today. No needs expressed. He will return home and follow-up with [facility]  Transportation Means: Pt reports access to transportation  Supports: No supports mentioned at this time  Chad CordialLauren [name], LCSWA 10/23/2015 9:19 AM

## 2015-10-23 NOTE — Progress Notes (Signed)
Pt requests that his PSR program, [facility], schedule his [facility].  Chad CordialLauren [name], LCSWA Clinical Social Work [phone]

## 2015-10-23 NOTE — Progress Notes (Addendum)
D:Per patient self inventory form pt reports he slept good last night. He reports a good appetite, normal energy level, good concentration.  Pt rates depression 0/10, hopelessness 0/10, anxiety 0/10- all on 0-10 scale, 10 being the worse. Pt denies SI/HI. Denies AVH. Pt denies physical pain. Reports his goal is "to go home" Voices no complaints at this time. Observed on the unit interacting with peers. Reports he is eager to be discharged.  A:Special checks q 15 mins in place for safety.Encouragement and support provided. Discharge planning in place.   R:Safety maintained. Compliant with medication regimen. Will continue to monitor.

## 2015-11-05 ENCOUNTER — Emergency Department (HOSPITAL_BASED_OUTPATIENT_CLINIC_OR_DEPARTMENT_OTHER)
Admission: EM | Admit: 2015-11-05 | Discharge: 2015-11-06 | Disposition: A | Discharge status: Home / Self Care | Payer: Medicaid Other | Attending: Emergency Medicine | Admitting: Emergency Medicine

## 2015-11-05 ENCOUNTER — Encounter (HOSPITAL_BASED_OUTPATIENT_CLINIC_OR_DEPARTMENT_OTHER): Payer: Self-pay | Admitting: *Deleted

## 2015-11-05 ENCOUNTER — Emergency Department (HOSPITAL_BASED_OUTPATIENT_CLINIC_OR_DEPARTMENT_OTHER): Payer: Medicaid Other

## 2015-11-05 DIAGNOSIS — W108XXA Fall (on) (from) other stairs and steps, initial encounter: Secondary | ICD-10-CM | POA: Insufficient documentation

## 2015-11-05 DIAGNOSIS — F319 Bipolar disorder, unspecified: Secondary | ICD-10-CM | POA: Insufficient documentation

## 2015-11-05 DIAGNOSIS — S3992XA Unspecified injury of lower back, initial encounter: Secondary | ICD-10-CM | POA: Insufficient documentation

## 2015-11-05 DIAGNOSIS — Y9289 Other specified places as the place of occurrence of the external cause: Secondary | ICD-10-CM | POA: Insufficient documentation

## 2015-11-05 DIAGNOSIS — S59912A Unspecified injury of left forearm, initial encounter: Secondary | ICD-10-CM | POA: Insufficient documentation

## 2015-11-05 DIAGNOSIS — Y9389 Activity, other specified: Secondary | ICD-10-CM | POA: Insufficient documentation

## 2015-11-05 DIAGNOSIS — Z79899 Other long term (current) drug therapy: Secondary | ICD-10-CM | POA: Insufficient documentation

## 2015-11-05 DIAGNOSIS — S99911A Unspecified injury of right ankle, initial encounter: Secondary | ICD-10-CM | POA: Diagnosis not present

## 2015-11-05 DIAGNOSIS — E7800 Pure hypercholesterolemia, unspecified: Secondary | ICD-10-CM | POA: Insufficient documentation

## 2015-11-05 DIAGNOSIS — Y998 Other external cause status: Secondary | ICD-10-CM | POA: Insufficient documentation
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
## 2015-11-06 MED ORDER — HYDROCODONE-ACETAMINOPHEN 5-325 MG PO TABS: 1.0000 | ORAL_TABLET | Freq: Four times a day (QID) | ORAL | Status: DC | PRN

## 2015-11-29 ENCOUNTER — Encounter (HOSPITAL_COMMUNITY): Payer: Self-pay | Admitting: Emergency Medicine

## 2015-11-29 ENCOUNTER — Emergency Department (HOSPITAL_COMMUNITY)
Admission: EM | Admit: 2015-11-29 | Discharge: 2015-11-30 | Disposition: A | Discharge status: Home / Self Care | Payer: Medicaid Other | Attending: Emergency Medicine | Admitting: Emergency Medicine

## 2015-11-29 DIAGNOSIS — F603 Borderline personality disorder: Secondary | ICD-10-CM | POA: Insufficient documentation

## 2015-11-29 DIAGNOSIS — Z8639 Personal history of other endocrine, nutritional and metabolic disease: Secondary | ICD-10-CM | POA: Diagnosis not present

## 2015-11-29 DIAGNOSIS — F250 Schizoaffective disorder, bipolar type: Secondary | ICD-10-CM | POA: Diagnosis present

## 2015-11-29 DIAGNOSIS — R45851 Suicidal ideations: Secondary | ICD-10-CM | POA: Diagnosis present

## 2015-11-29 DIAGNOSIS — F319 Bipolar disorder, unspecified: Secondary | ICD-10-CM | POA: Insufficient documentation

## 2015-11-29 DIAGNOSIS — Z79899 Other long term (current) drug therapy: Secondary | ICD-10-CM | POA: Insufficient documentation

## 2015-11-29 LAB — CBC
HEMATOCRIT: 39.8 % (ref 39.0–52.0)
HEMOGLOBIN: 12.9 g/dL — AB (ref 13.0–17.0)
MCH: 26.7 pg (ref 26.0–34.0)
MCHC: 32.4 g/dL (ref 30.0–36.0)
MCV: 82.2 fL (ref 78.0–100.0)
Platelets: 346 10*3/uL (ref 150–400)
RBC: 4.84 MIL/uL (ref 4.22–5.81)
RDW: 13.6 % (ref 11.5–15.5)
WBC: 12.1 10*3/uL — AB (ref 4.0–10.5)

## 2015-11-29 LAB — RAPID URINE DRUG SCREEN, HOSP PERFORMED
AMPHETAMINES: NOT DETECTED
BARBITURATES: NOT DETECTED
BENZODIAZEPINES: NOT DETECTED
COCAINE: NOT DETECTED
OPIATES: NOT DETECTED
TETRAHYDROCANNABINOL: NOT DETECTED

## 2015-11-29 LAB — COMPREHENSIVE METABOLIC PANEL
ALBUMIN: 4.4 g/dL (ref 3.5–5.0)
ALK PHOS: 61 U/L (ref 38–126)
ALT: 21 U/L (ref 17–63)
ANION GAP: 9 (ref 5–15)
AST: 20 U/L (ref 15–41)
BUN: 11 mg/dL (ref 6–20)
CHLORIDE: 104 mmol/L (ref 101–111)
CO2: 27 mmol/L (ref 22–32)
Calcium: 9.2 mg/dL (ref 8.9–10.3)
Creatinine, Ser: 1.04 mg/dL (ref 0.61–1.24)
GFR calc non Af Amer: >60 mL/min (ref >60)
GLUCOSE: 122 mg/dL — AB (ref 65–99)
Potassium: 3.7 mmol/L (ref 3.5–5.1)
SODIUM: 140 mmol/L (ref 135–145)
Total Bilirubin: 0.8 mg/dL (ref 0.3–1.2)
Total Protein: 7.4 g/dL (ref 6.5–8.1)

## 2015-11-29 LAB — ETHANOL: Alcohol, Ethyl (B): <5 mg/dL (ref <5)

## 2015-11-29 LAB — SALICYLATE LEVEL

## 2015-11-29 LAB — ACETAMINOPHEN LEVEL

## 2015-11-29 MED ORDER — LORAZEPAM 1 MG PO TABS: 1.0000 mg | ORAL_TABLET | Freq: Three times a day (TID) | ORAL | Status: DC | PRN

## 2015-11-29 MED ORDER — ZOLPIDEM TARTRATE 5 MG PO TABS: 5.0000 mg | ORAL_TABLET | Freq: Every evening | ORAL | Status: DC | PRN

## 2015-11-29 MED ORDER — ONDANSETRON HCL 4 MG PO TABS: 4.0000 mg | ORAL_TABLET | Freq: Three times a day (TID) | ORAL | Status: DC | PRN

## 2015-11-29 MED ORDER — ACETAMINOPHEN 325 MG PO TABS: 650.0000 mg | ORAL_TABLET | ORAL | Status: DC | PRN

## 2015-11-29 MED ORDER — ALUM & MAG HYDROXIDE-SIMETH 200-200-20 MG/5ML PO SUSP: 30.0000 mL | ORAL | Status: DC | PRN

## 2015-11-29 MED ORDER — ARIPIPRAZOLE 5 MG PO TABS
5.0000 mg | ORAL_TABLET | Freq: Every day | ORAL | Status: DC
  Administered 2015-11-29: 5 mg via ORAL
  Filled 2015-11-29 (×3): qty 1

## 2015-11-29 MED ORDER — ALBUTEROL SULFATE HFA 108 (90 BASE) MCG/ACT IN AERS: 2.0000 | INHALATION_SPRAY | RESPIRATORY_TRACT | Status: DC | PRN

## 2015-11-29 NOTE — BH Assessment (Addendum)
Assessment Note  [name] is an 29 y.o. male who presents to [facility] under IVC from [facility]. He states that he got into an argument with staff and asked to speak with his therapist because he was going to be suicidal. He states that he did not have a plan and when he said that he meant that he wanted to speak to his therapist "to calm down because I was upset." e states "I told my therapist that I had thoughts of suicide  But I don't have a plan I'm feeling a little depressed can I talk to you about this?" Patient states "I was in hurting self mode, not to the extreme like I want to die." Patient denies current SI with no intent or plan but was seen in [facility] for SI in November of this year. Patient states that he has one suicide attempt over seven years ago when he attempted to overdose. Patient states that he has not had any other attempts. Patient denies self injurious behaviors. Patient denies HI and history of being violent towards others. Patient denies pending charges and upcoming court dates. Patient denies access to weapons and denies telling staff that he had a gun. Patient  Denies history of trauma or abuse. Patient states that his family is very supportive. Patient denies use of drugs and alcohol. Patient UDS and BAL clear.    Consulted with Nanine Means, DNP who recommends patient be observed overnight and evaluated in the morning.    Diagnosis: Bipolar Disorder  Past Medical History:  Past Medical History  Diagnosis Date  . Bipolar 1 disorder (HCC)   . High cholesterol     Past Surgical History  Procedure Laterality Date  . Pilonidal cyst excision    . Ganglion cyst excision      Family History: No family history on file.  Social History:  reports that he has never smoked. He has never used smokeless tobacco. He reports that he does not drink alcohol or use illicit drugs.  Additional Social History:  Alcohol / Drug Use Pain Medications: See PTA Prescriptions: See  PTA Over the Counter: See PTA History of alcohol / drug use?: No history of alcohol / drug abuse  CIWA: CIWA-Ar BP: 141/84 mmHg Pulse Rate: 81 COWS:    Allergies:  Allergies  Allergen Reactions  . Aspirin     Bleeding in urine  . Ibuprofen     "bleeding in urine"  . Metformin And Related Diarrhea    Leg muscles became weak  . Ritalin [Methylphenidate Hcl]     Suicidal thoughts  . Shellfish-Derived Products Hives    Home Medications:  (Not in a hospital admission)  OB/GYN Status:  No LMP for male patient.  General Assessment Data [facility] of Assessment: [facility] T[facility] Assessment: In system Is this a Tele or Face-to-Face Assessment?: Face-to-Face Is this an Initial Assessment or a Re-assessment for this encounter?: Initial Assessment Marital status: Single Maiden name: N/A Is patient pregnant?: No Pregnancy Status: No Living Arrangements: Parent, Other relatives (sister) Can pt return to current living arrangement?: Yes Admission Status: Voluntary Is patient capable of signing voluntary admission?: Yes Referral Source: Other ([facility]) Insurance type: Medicaid     Crisis Care Plan Living Arrangements: Parent, Other relatives (sister) Name of Psychiatrist: [facility] Name of Therapist: [facility]  Education Status Is patient currently in school?: No Highest grade of school patient has completed: GED  Risk to self with the past 6 months Suicidal Ideation: No Has patient been  a risk to self within the past 6 months prior to admission? : Yes Suicidal Intent: No Has patient had any suicidal intent within the past 6 months prior to admission? : No Is patient at risk for suicide?: No Suicidal Plan?: No Has patient had any suicidal plan within the past 6 months prior to admission? : Yes Access to Means: No What has been your use of drugs/alcohol within the last 12 months?: Denies Previous Attempts/Gestures: Yes How many times?: 1 (7 years ago) Other Self Harm Risks:  Denies Triggers for Past Attempts: Other (Comment) (grandparents died) Intentional Self Injurious Behavior: None Family Suicide History: No Recent stressful life event(s): Other (Comment) (argument with staff) Persecutory voices/beliefs?: No Depression: No Depression Symptoms:  (denies) Substance abuse history and/or treatment for substance abuse?: No Suicide prevention information given to non-admitted patients: Not applicable  Risk to Others within the past 6 months Homicidal Ideation: No Does patient have any lifetime risk of violence toward others beyond the six months prior to admission? : No Thoughts of Harm to Others: No Current Homicidal Intent: No Current Homicidal Plan: No Access to Homicidal Means: No Identified Victim: Denies History of harm to others?: No Assessment of Violence: None Noted Violent Behavior Description: Denies Does patient have access to weapons?: No Criminal Charges Pending?: No Does patient have a court date: No Is patient on probation?: No  Psychosis Hallucinations: None noted Delusions: None noted  Mental Status Report Appearance/Hygiene: Unremarkable, In scrubs Eye Contact: Good Motor Activity: Unable to assess Speech: Logical/coherent Level of Consciousness: Alert Mood: Pleasant Affect: Appropriate to circumstance Anxiety Level: Minimal Thought Processes: Coherent, Relevant Judgement: Unimpaired Orientation: Person, Place, Time, Situation, Appropriate for developmental age Obsessive Compulsive Thoughts/Behaviors: None  Cognitive Functioning Concentration: Good Memory: Recent Intact, Remote Intact IQ: Average Insight: Good Impulse Control: Fair Appetite: Good Sleep: No Change Total Hours of Sleep: 8 Vegetative Symptoms: None  ADLScreening [facility] Assessment Services) Patient's cognitive ability adequate to safely complete daily activities?: Yes Patient able to express need for assistance with ADLs?: Yes Independently performs  ADLs?: Yes (appropriate for developmental age)  Prior Inpatient Therapy Prior Inpatient Therapy: Yes Prior Therapy Dates: 10/2015- most recent [facility] Reason for Treatment: Depression  Prior Outpatient Therapy Prior Outpatient Therapy: Yes Prior Therapy Dates: March 2016- Present Prior Therapy Facilty/Provider(s): [facility] Reason for Treatment: PSR/Therapy - Depression Does patient have an ACCT team?: No Does patient have Intensive In-House Services?  : No Does patient have [facility] services? : No Does patient have P4CC services?: No  ADL Screening (condition at time of admission) Patient's cognitive ability adequate to safely complete daily activities?: Yes Is the patient deaf or have difficulty hearing?: No Does the patient have difficulty seeing, even when wearing glasses/contacts?: No Does the patient have difficulty concentrating, remembering, or making decisions?: No Patient able to express need for assistance with ADLs?: Yes Does the patient have difficulty dressing or bathing?: No Independently performs ADLs?: Yes (appropriate for developmental age) Does the patient have difficulty walking or climbing stairs?: No Weakness of Legs: None Weakness of Arms/Hands: None  Home Assistive Devices/Equipment Home Assistive Devices/Equipment: None  Therapy Consults (therapy consults require a physician order) PT Evaluation Needed: No OT Evalulation Needed: No SLP Evaluation Needed: No Abuse/Neglect Assessment (Assessment to be complete while patient is alone) Physical Abuse: Denies Verbal Abuse: Denies Sexual Abuse: Denies Exploitation of patient/patient's resources: Denies Self-Neglect: Denies Values / Beliefs Cultural Requests During Hospitalization: None Spiritual Requests During Hospitalization: None Consults Spiritual Care Consult Needed:  No Social Work Consult Needed: No Merchant navy officer (For Healthcare) Does patient have an advance  directive?: No Would patient like information on creating an advanced directive?: Yes English as a second language teacher given    Additional Information 1:1 In Past 12 Months?: No CIRT Risk: No Elopement Risk: No Does patient have medical clearance?: Yes     Disposition:  Disposition Initial Assessment Completed for this Encounter: Yes Disposition of Patient: Other dispositions Other disposition(s):  (observe overnight)  On Site Evaluation by:   Reviewed with Physician:    [name] 11/29/2015 8:39 PM

## 2015-11-29 NOTE — ED Notes (Signed)
Patient appears flat; cooperative. Denies SI, HI, AVH. Denies any feelings of anxiety or depression at present. Reports troubles staying asleep nights. No noted changes in weight or appetite.   Encouragement offered. Snack provided.

## 2015-11-29 NOTE — ED Provider Notes (Signed)
CSN: [id number]     Arrival date & time 11/29/15  1457 History   First MD Initiated Contact with Patient 11/29/15 1512     Chief Complaint  Patient presents with  . Suicidal     (Consider location/radiation/quality/duration/timing/severity/associated sxs/prior Treatment) HPI   [name] is a 29 y.o. male, with a history of bipolar, borderline personality, presenting to the ED with suicidal ideations for the last 2 weeks. Pt states he told his therapist that he wanted to kill himself. Pt states he does not have a plan. Pt has no physical complaints at this time. Pt denies drug or alcohol use. Pt confirms that he is compliant with his Abilify. Pt had a previous suicide attempt 7 years ago with a tylenol OD. Pt denies A/V hallucinations.     Past Medical History  Diagnosis Date  . Bipolar 1 disorder (HCC)   . High cholesterol    Past Surgical History  Procedure Laterality Date  . Pilonidal cyst excision    . Ganglion cyst excision     No family history on file. Social History  Substance Use Topics  . Smoking status: Never Smoker   . Smokeless tobacco: Never Used  . Alcohol Use: No    Review of Systems  Psychiatric/Behavioral: Positive for suicidal ideas.  All other systems reviewed and are negative.     Allergies  Aspirin; Ibuprofen; Metformin and related; Ritalin; and Shellfish-derived products  Home Medications   Prior to Admission medications   Medication Sig Start Date End Date Taking? Authorizing Provider  ARIPiprazole (ABILIFY) 5 MG tablet Take 1 tablet (5 mg total) by mouth at bedtime. 10/23/15  Yes Truman Haywardakia S Starkes, FNP  albuterol (PROVENTIL HFA;VENTOLIN HFA) 108 (90 BASE) MCG/ACT inhaler Inhale 2 puffs into the lungs every 4 (four) hours as needed for wheezing or shortness of breath. 10/23/15   Truman Haywardakia S Starkes, FNP   BP 127/79 mmHg  Pulse 78  Temp(Src) 98.2 F (36.8 C) (Oral)  Resp 20  SpO2 97% Physical Exam  Constitutional: He is oriented to  person, place, and time. He appears well-developed and well-nourished. No distress.  HENT:  Head: Normocephalic and atraumatic.  Eyes: Conjunctivae and EOM are normal. Pupils are equal, round, and reactive to light.  Neck: Normal range of motion. Neck supple.  Cardiovascular: Normal rate, regular rhythm and normal heart sounds.   Pulmonary/Chest: Effort normal and breath sounds normal. No respiratory distress.  Abdominal: Soft. Bowel sounds are normal.  Musculoskeletal: He exhibits no edema or tenderness.  Lymphadenopathy:    He has no cervical adenopathy.  Neurological: He is alert and oriented to person, place, and time. He has normal reflexes.  No sensory deficits. Strength 5/5 in all extremities. No gait disturbance. Coordination intact. Cranial nerves III-XII grossly intact. No facial droop.   Skin: Skin is warm and dry. He is not diaphoretic.  Nursing note and vitals reviewed.   ED Course  Procedures (including critical care time) Labs Review Labs Reviewed  COMPREHENSIVE METABOLIC PANEL - Abnormal; Notable for the following:    Glucose, Bld 122 (*)    All other components within normal limits  ACETAMINOPHEN LEVEL - Abnormal; Notable for the following:    Acetaminophen (Tylenol), Serum <10 (*)    All other components within normal limits  CBC - Abnormal; Notable for the following:    WBC 12.1 (*)    Hemoglobin 12.9 (*)    All other components within normal limits  ETHANOL  SALICYLATE LEVEL  URINE RAPID  DRUG SCREEN, HOSP PERFORMED    Imaging Review No results found. I have personally reviewed and evaluated these images and lab results as part of my medical decision-making.   EKG Interpretation None      MDM   Final diagnoses:  Suicidal ideations    Chane Cowden presents with suicidal ideations for the past 2 weeks.  The patient has sufficient evidence for inpatient evaluation of suicidal ideations. Patient apparently made statements to the nurse, "I have a  gun and I could also take all the TYLENOL that I have." Patient is currently here voluntarily, but there is enough evidence to IVC if necessary. Patient apparently denied suicidal ideations to [facility], the Community education officer. Patient was placed in psych hold, was medically cleared with no abnormalities found, and his home medications were ordered.  Filed Vitals:   11/29/15 1513  BP: 127/79  Pulse: 78  Temp: 98.2 F (36.8 C)  TempSrc: Oral  Resp: 20  SpO2: 97%     Anselm Pancoast, PA-C 11/29/15 1735  Richardean Canal, MD 11/29/15 [phone]

## 2015-11-29 NOTE — BH Assessment (Signed)
Consulted with Nanine MeansJamison Lord, DNP who recommends patient be observed overnight and evaluated by psychiatry in the morning.   Davina PokeJoVea Jeralynn Vaquera, LCSW Therapeutic Triage Specialist [facility] 11/29/2015 7:29 PM

## 2015-11-29 NOTE — ED Notes (Signed)
Pt has poor eye contact and decreased quality of sleep. Pt reports that he had increased depression with si and no plan. He told his therapist and she wanted him evaluated at the hospital. Pt said that he got into an argument with a [facility] person before talking to his therapist. He denies hallucinations and hi. Safety maintained.

## 2015-11-29 NOTE — ED Notes (Signed)
Patient presents with [facility] for SI. States "I call my therapist and told them I wanted to kill myself". When asked about plan, patient states "I have a gun and I would take all the tylenol I have". Patient denies HI, AVH. Denies other c/c.

## 2015-11-30 DIAGNOSIS — R45851 Suicidal ideations: Secondary | ICD-10-CM | POA: Insufficient documentation

## 2015-11-30 DIAGNOSIS — F250 Schizoaffective disorder, bipolar type: Secondary | ICD-10-CM | POA: Diagnosis not present

## 2015-11-30 NOTE — Consult Note (Signed)
[facility] Consult   Reason for Consult:  Anger, depression Referring Physician:  EDP Patient Identification: [name] MRN:  [id number] Principal Diagnosis: Schizoaffective disorder, bipolar type [facility]) Diagnosis:   Patient Active Problem List   Diagnosis Date Noted  . Schizoaffective disorder, bipolar type ([place]) [F25.0] 10/18/2015    Priority: High  . Schizoaffective disorder, depressive type ([place]) [F25.1] 10/18/2015  . Suicidal ideation [R45.851]     Total Time spent with patient: 45 minutes  Subjective:   [name] is a 29 y.o. male patient admitted with  Anger, depression  HPI:  AA male, 29 years old was brought in by his mother for suicidal thought and ideations with no plans.  Patient reports that he went to his day program yesterday at [facility] and informed his therapist that he was angry at a staff member who talked down on him.  Patient reports that he wanted to speak to his counselor to avoid situation getting worse.  Patient states that his counselor called his mother who brought him to the ER.  Patient was hospitalized at our [facility] last month and reports that he has been doing well and taking his medications.  He is engaged in day program at [facility] and reports that he love the program.  He lives with his parents and reports that he is happy staying with them.  He denies SI/HI/AVH.   Patient is discharged home.  Past Psychiatric History:  Schizoaffective disorder, Bipolar type  Risk to Self: Suicidal Ideation: No Suicidal Intent: No Is patient at risk for suicide?: No Suicidal Plan?: No Access to Means: No What has been your use of drugs/alcohol within the last 12 months?: Denies How many times?: 1 (28 years ago) Other Self Harm Risks: Denies Triggers for Past Attempts: Other (Comment) (grandparents died) Intentional Self Injurious Behavior: None Risk to Others: Homicidal Ideation: No Thoughts of Harm to Others: No Current Homicidal Intent:  No Current Homicidal Plan: No Access to Homicidal Means: No Identified Victim: Denies History of harm to others?: No Assessment of Violence: None Noted Violent Behavior Description: Denies Does patient have access to weapons?: No Criminal Charges Pending?: No Does patient have a court date: No Prior Inpatient Therapy: Prior Inpatient Therapy: Yes Prior Therapy Dates: 10/2015- most recent Prior Therapy Facilty/Provider([facility] Reason for Treatment: Depression Prior Outpatient Therapy: Prior Outpatient Therapy: Yes Prior Therapy Dates: March 2016- Present Prior Therapy Facilty/Provider(s): [facility] Reason for Treatment: PSR/Therapy - Depression Does patient have an ACCT team?: No Does patient have Intensive In-House Services?  : No Does patient have [facility] services? : No Does patient have P4CC services?: No  Past Medical History:  Past Medical History  Diagnosis Date  . Bipolar 1 disorder ([place])   . High cholesterol     Past Surgical History  Procedure Laterality Date  . Pilonidal cyst excision    . Ganglion cyst excision     Family History: No family history on file.   Family Psychiatric  History:  Uknown Social History:  History  Alcohol Use No     History  Drug Use No    Social History   Social History  . Marital Status: Single    Spouse Name: N/A  . Number of Children: N/A  . Years of Education: N/A   Social History Main Topics  . Smoking status: Never Smoker   . Smokeless tobacco: Never Used  . Alcohol Use: No  . Drug Use: No  . Sexual Activity: Not Asked  Other Topics Concern  . None   Social History Narrative   Additional Social History:    Pain Medications: See PTA Prescriptions: See PTA Over the Counter: See PTA History of alcohol / drug use?: No history of alcohol / drug abuse    Allergies:   Allergies  Allergen Reactions  . Aspirin     Bleeding in urine  . Ibuprofen     "bleeding in urine"  . Metformin And Related Diarrhea     Leg muscles became weak  . Ritalin [Methylphenidate Hcl]     Suicidal thoughts  . Shellfish-Derived Products Hives    Labs:  Results for orders placed or performed during the hospital encounter of 11/29/15 (from the past 48 hour(s))  Urine rapid drug screen (hosp performed) (Not at [facility])     Status: None   Collection Time: 11/29/15  3:14 PM  Result Value Ref Range   Opiates NONE DETECTED NONE DETECTED   Cocaine NONE DETECTED NONE DETECTED   Benzodiazepines NONE DETECTED NONE DETECTED   Amphetamines NONE DETECTED NONE DETECTED   Tetrahydrocannabinol NONE DETECTED NONE DETECTED   Barbiturates NONE DETECTED NONE DETECTED    Comment:        DRUG SCREEN FOR MEDICAL PURPOSES ONLY.  IF CONFIRMATION IS NEEDED FOR ANY PURPOSE, NOTIFY LAB WITHIN 5 DAYS.        LOWEST DETECTABLE LIMITS FOR URINE DRUG SCREEN Drug Class       Cutoff (ng/mL) Amphetamine      1000 Barbiturate      200 Benzodiazepine   812 Tricyclics       751 Opiates          300 Cocaine          300 THC              50   Comprehensive metabolic panel     Status: Abnormal   Collection Time: 11/29/15  3:31 PM  Result Value Ref Range   Sodium 140 135 - 145 mmol/L   Potassium 3.7 3.5 - 5.1 mmol/L   Chloride 104 101 - 111 mmol/L   CO2 27 22 - 32 mmol/L   Glucose, Bld 122 (H) 65 - 99 mg/dL   BUN 11 6 - 20 mg/dL   Creatinine, Ser 1.04 0.61 - 1.24 mg/dL   Calcium 9.2 8.9 - 10.3 mg/dL   Total Protein 7.4 6.5 - 8.1 g/dL   Albumin 4.4 3.5 - 5.0 g/dL   AST 20 15 - 41 U/L   ALT 21 17 - 63 U/L   Alkaline Phosphatase 61 38 - 126 U/L   Total Bilirubin 0.8 0.3 - 1.2 mg/dL   GFR calc non Af Amer >60 >60 mL/min   GFR calc Af Amer >60 >60 mL/min    Comment: (NOTE) The eGFR has been calculated using the CKD EPI equation. This calculation has not been validated in all clinical situations. eGFR's persistently <60 mL/min signify possible Chronic Kidney Disease.    Anion gap 9 5 - 15  Ethanol (ETOH)     Status: None    Collection Time: 11/29/15  3:31 PM  Result Value Ref Range   Alcohol, Ethyl (B) <5 <5 mg/dL    Comment:        LOWEST DETECTABLE LIMIT FOR SERUM ALCOHOL IS 5 mg/dL FOR MEDICAL PURPOSES ONLY   Salicylate level     Status: None   Collection Time: 11/29/15  3:31 PM  Result Value Ref Range   Salicylate Lvl <  4.0 2.8 - 30.0 mg/dL  Acetaminophen level     Status: Abnormal   Collection Time: 11/29/15  3:31 PM  Result Value Ref Range   Acetaminophen (Tylenol), Serum <10 (L) 10 - 30 ug/mL    Comment:        THERAPEUTIC CONCENTRATIONS VARY SIGNIFICANTLY. A RANGE OF 10-30 ug/mL MAY BE AN EFFECTIVE CONCENTRATION FOR MANY PATIENTS. HOWEVER, SOME ARE BEST TREATED AT CONCENTRATIONS OUTSIDE THIS RANGE. ACETAMINOPHEN CONCENTRATIONS >150 ug/mL AT 4 HOURS AFTER INGESTION AND >50 ug/mL AT 12 HOURS AFTER INGESTION ARE OFTEN ASSOCIATED WITH TOXIC REACTIONS.   CBC     Status: Abnormal   Collection Time: 11/29/15  3:31 PM  Result Value Ref Range   WBC 12.1 (H) 4.0 - 10.5 K/uL   RBC 4.84 4.22 - 5.81 MIL/uL   Hemoglobin 12.9 (L) 13.0 - 17.0 g/dL   HCT 39.8 39.0 - 52.0 %   MCV 82.2 78.0 - 100.0 fL   MCH 26.7 26.0 - 34.0 pg   MCHC 32.4 30.0 - 36.0 g/dL   RDW 13.6 11.5 - 15.5 %   Platelets 346 150 - 400 K/uL    Current Facility-Administered Medications  Medication Dose Route Frequency Provider Last Rate Last Dose  . acetaminophen (TYLENOL) tablet 650 mg  650 mg Oral Q4H PRN Shawn C Joy, PA-C      . albuterol (PROVENTIL HFA;VENTOLIN HFA) 108 (90 Base) MCG/ACT inhaler 2 puff  2 puff Inhalation Q4H PRN Shawn C Joy, PA-C      . alum & mag hydroxide-simeth (MAALOX/MYLANTA) 200-200-20 MG/5ML suspension 30 mL  30 mL Oral PRN Shawn C Joy, PA-C      . ARIPiprazole (ABILIFY) tablet 5 mg  5 mg Oral QHS Shawn C Joy, PA-C   5 mg at 11/29/15 2213  . LORazepam (ATIVAN) tablet 1 mg  1 mg Oral Q8H PRN Shawn C Joy, PA-C      . ondansetron (ZOFRAN) tablet 4 mg  4 mg Oral Q8H PRN Shawn C Joy, PA-C      . zolpidem  (AMBIEN) tablet 5 mg  5 mg Oral QHS PRN Lorayne Bender, PA-C       Current Outpatient Prescriptions  Medication Sig Dispense Refill  . ARIPiprazole (ABILIFY) 5 MG tablet Take 1 tablet (5 mg total) by mouth at bedtime. 30 tablet 0  . albuterol (PROVENTIL HFA;VENTOLIN HFA) 108 (90 BASE) MCG/ACT inhaler Inhale 2 puffs into the lungs every 4 (four) hours as needed for wheezing or shortness of breath. 1 Inhaler 0    Musculoskeletal: Strength & Muscle Tone: within normal limits Gait & Station: normal Patient leans: N/A  Psychiatric Specialty Exam: Review of Systems  Constitutional: Negative.   HENT: Negative.   Eyes: Negative.   Respiratory: Negative.   Cardiovascular: Negative.   Gastrointestinal: Negative.   Genitourinary: Negative.   Musculoskeletal: Negative.   Skin: Negative.   Neurological: Negative.   Endo/Heme/Allergies: Negative.     Blood pressure 119/57, pulse 72, temperature 97.5 F (36.4 C), temperature source Oral, resp. rate 18, SpO2 100 %.There is no weight on file to calculate BMI.  General Appearance: Casual and Fairly Groomed  Engineer, water::  Good  Speech:  Clear and Coherent and Normal Rate  Volume:  Normal  Mood:  Euthymic  Affect:  Congruent  Thought Process:  Coherent, Goal Directed and Intact  Orientation:  Full (Time, Place, and Person)  Thought Content:  WDL  Suicidal Thoughts:  No  Homicidal Thoughts:  No  Memory:  Immediate;   Good Recent;   Good Remote;   Good  Judgement:  Good  Insight:  Good  Psychomotor Activity:  Normal  Concentration:  Good  Recall:  NA  Fund of Knowledge:Good  Language: Good  Akathisia:  NA  Handed:  Right  AIMS (if indicated):     Assets:  Desire for Improvement  ADL's:  Intact  Cognition: WNL  Sleep:      Disposition: Discharge home.  Delfin Gant   PMHNP-BC 11/30/2015 1:02 PM  Agree with NP assessment and note

## 2015-11-30 NOTE — [facility] Suicide Risk Assessment (Cosign Needed)
Suicide Risk Assessment  Discharge Assessment   [facility]   Demographic Factors:  Male, Adolescent or young adult, Low socioeconomic status and Unemployed  Total Time spent with patient: 20 minutes  Musculoskeletal: Strength & Muscle Tone: within normal limits Gait & Station: normal Patient leans: N/A  Psychiatric Specialty Exam:     Blood pressure 119/57, pulse 72, temperature 97.5 F (36.4 C), temperature source Oral, resp. rate 18, SpO2 100 %.There is no weight on file to calculate BMI.  General Appearance: Casual and Fairly Groomed  Patent attorneyye Contact:: Good  Speech: Clear and Coherent and Normal Rate  Volume: Normal  Mood: Euthymic  Affect: Congruent  Thought Process: Coherent, Goal Directed and Intact  Orientation: Full (Time, Place, and Person)  Thought Content: WDL  Suicidal Thoughts: No  Homicidal Thoughts: No  Memory: Immediate; Good Recent; Good Remote; Good  Judgement: Good  Insight: Good  Psychomotor Activity: Normal  Concentration: Good  Recall: NA  Fund of Knowledge:Good  Language: Good  Akathisia: NA  Handed: Right  AIMS (if indicated):    Assets: Desire for Improvement  ADL's: Intact  Cognition: WNL            Has this patient used any form of tobacco in the last 30 days? (Cigarettes, Smokeless Tobacco, Cigars, and/or Pipes) Yes, A prescription for an FDA-approved tobacco cessation medication was offered at discharge and the patient refused  Mental Status Per Nursing Assessment::   On Admission:     Current Mental Status by Physician: NA  Loss Factors: NA  Historical Factors: NA  Risk Reduction Factors:   Living with another person, especially a relative and Positive social support  Continued Clinical Symptoms:  Bipolar Disorder:   Depressive phase  Cognitive Features That Contribute To Risk:  Polarized thinking    Suicide Risk:  Minimal: No  identifiable suicidal ideation.  Patients presenting with no risk factors but with morbid ruminations; may be classified as minimal risk based on the severity of the depressive symptoms  Principal Problem: Schizoaffective disorder, bipolar type [facility](HCC) Discharge Diagnoses:  Patient Active Problem List   Diagnosis Date Noted  . Schizoaffective disorder, bipolar type (HCC) [F25.0] 10/18/2015    Priority: Medium  . Suicidal ideations [R45.851]   . Schizoaffective disorder, depressive type (HCC) [F25.1] 10/18/2015  . Suicidal ideation [R45.851]       Plan Of Care/Follow-up recommendations:  Activity:  AS TOLERATED Diet:  REGULAR  Is patient on multiple antipsychotic therapies at discharge:  No   Has Patient had three or more failed trials of antipsychotic monotherapy by history:  No  Recommended Plan for Multiple Antipsychotic Therapies: NA    Earney NavyJosephine C Bhakti Labella   PMHNP-BC 11/30/2015, 6:31 PM

## 2015-11-30 NOTE — BH Assessment (Signed)
Spoke with patients mother Ms. [name] at [phone] who states that she feels "perfectly safe" with the patient returning home. She states that there are no weapons such as firearms in the home. She asked if the patient could be discharged today and was informed that this Clinical research associatewriter will speak with the psychiatrist and speak with the patient regarding plans for discharge who will call her.    Spoke with Dr. Jama Flavorsobos and Brian ByesJosephine Onuoha, NP who recommends patient be discharged back home. Informed patient of discharge.   Brian PokeJoVea Sulaiman Imbert, LCSW Therapeutic Triage Specialist [facility] 11/30/2015 11:53 AM

## 2016-01-23 ENCOUNTER — Emergency Department (HOSPITAL_COMMUNITY)
Admission: EM | Admit: 2016-01-23 | Discharge: 2016-01-23 | Disposition: A | Discharge status: Other Acute Inpatient Hospital | Payer: Medicaid Other | Attending: Emergency Medicine | Admitting: Emergency Medicine

## 2016-01-23 ENCOUNTER — Encounter (HOSPITAL_COMMUNITY): Payer: Self-pay | Admitting: Emergency Medicine

## 2016-01-23 DIAGNOSIS — F319 Bipolar disorder, unspecified: Secondary | ICD-10-CM | POA: Diagnosis not present

## 2016-01-23 DIAGNOSIS — R45851 Suicidal ideations: Secondary | ICD-10-CM

## 2016-01-23 DIAGNOSIS — Z79899 Other long term (current) drug therapy: Secondary | ICD-10-CM | POA: Diagnosis not present

## 2016-01-23 DIAGNOSIS — Z8639 Personal history of other endocrine, nutritional and metabolic disease: Secondary | ICD-10-CM | POA: Diagnosis not present

## 2016-01-23 LAB — CBC
HCT: 41.2 % (ref 39.0–52.0)
Hemoglobin: 13.2 g/dL (ref 13.0–17.0)
MCH: 26.1 pg (ref 26.0–34.0)
MCHC: 32 g/dL (ref 30.0–36.0)
MCV: 81.4 fL (ref 78.0–100.0)
PLATELETS: 393 10*3/uL (ref 150–400)
RBC: 5.06 MIL/uL (ref 4.22–5.81)
RDW: 13.6 % (ref 11.5–15.5)
WBC: 11.4 10*3/uL — AB (ref 4.0–10.5)

## 2016-01-23 LAB — COMPREHENSIVE METABOLIC PANEL
ALT: 23 U/L (ref 17–63)
ANION GAP: 8 (ref 5–15)
AST: 23 U/L (ref 15–41)
Albumin: 4.4 g/dL (ref 3.5–5.0)
Alkaline Phosphatase: 61 U/L (ref 38–126)
BILIRUBIN TOTAL: 0.9 mg/dL (ref 0.3–1.2)
BUN: 10 mg/dL (ref 6–20)
CO2: 25 mmol/L (ref 22–32)
Calcium: 9.4 mg/dL (ref 8.9–10.3)
Chloride: 106 mmol/L (ref 101–111)
Creatinine, Ser: 1.05 mg/dL (ref 0.61–1.24)
GFR calc Af Amer: >60 mL/min (ref >60)
Glucose, Bld: 98 mg/dL (ref 65–99)
POTASSIUM: 3.9 mmol/L (ref 3.5–5.1)
Sodium: 139 mmol/L (ref 135–145)
TOTAL PROTEIN: 7.6 g/dL (ref 6.5–8.1)

## 2016-01-23 LAB — RAPID URINE DRUG SCREEN, HOSP PERFORMED
Amphetamines: NOT DETECTED
BENZODIAZEPINES: NOT DETECTED
Barbiturates: NOT DETECTED
Cocaine: NOT DETECTED
Opiates: NOT DETECTED
Tetrahydrocannabinol: NOT DETECTED

## 2016-01-23 LAB — SALICYLATE LEVEL: Salicylate Lvl: <4 mg/dL (ref 2.8–30.0)

## 2016-01-23 LAB — ACETAMINOPHEN LEVEL

## 2016-01-23 LAB — ETHANOL

## 2016-01-23 MED ORDER — ONDANSETRON HCL 4 MG PO TABS: 4.0000 mg | ORAL_TABLET | Freq: Three times a day (TID) | ORAL | Status: DC | PRN

## 2016-01-23 MED ORDER — ACETAMINOPHEN 325 MG PO TABS: 650.0000 mg | ORAL_TABLET | ORAL | Status: DC | PRN

## 2016-01-23 MED ORDER — ZOLPIDEM TARTRATE 5 MG PO TABS: 5.0000 mg | ORAL_TABLET | Freq: Every evening | ORAL | Status: DC | PRN

## 2016-01-23 NOTE — ED Notes (Signed)
Pt ambulatory to room 37 w/o difficulty

## 2016-01-23 NOTE — ED Notes (Signed)
telepsychic in  Progress

## 2016-01-23 NOTE — ED Notes (Signed)
Patient appears flat, cooperative. Denies SI, HI, AVH. Denies any recent changes in appetite or sleep. Patient also denies feelings of anxiety and depression at present.   Encouragement offered. Beverage provided.  Q 15 safety checks continue.

## 2016-01-23 NOTE — ED Provider Notes (Addendum)
CSN: [id number]     Arrival date & time 01/23/16  1410 History   First MD Initiated Contact with Patient 01/23/16 1532     Chief Complaint  Patient presents with  . Suicidal     (Consider location/radiation/quality/duration/timing/severity/associated sxs/prior Treatment) HPI Comments: Pt comes to the ER with SI.  He has hx of depression and bipolar disorder. PT saw his therapist, and he reported SI with desires to jump in front of a moving car. PT states that he has been feeling unwell for few days now, mainly due to the fact that he feels being inferiorly treated by everyone, including the family. He has no medical complains. No drug use.    The history is provided by the patient.    Past Medical History  Diagnosis Date  . Bipolar 1 disorder (HCC)   . High cholesterol    Past Surgical History  Procedure Laterality Date  . Pilonidal cyst excision    . Ganglion cyst excision     History reviewed. No pertinent family history. Social History  Substance Use Topics  . Smoking status: Never Smoker   . Smokeless tobacco: Never Used  . Alcohol Use: No    Review of Systems  Constitutional: Negative for activity change and appetite change.  Respiratory: Negative for cough and shortness of breath.   Cardiovascular: Negative for chest pain.  Gastrointestinal: Negative for abdominal pain.  Genitourinary: Negative for dysuria.  Psychiatric/Behavioral: Positive for suicidal ideas and self-injury. Negative for sleep disturbance. The patient is not hyperactive.       Allergies  Aspirin; Ibuprofen; Metformin and related; Ritalin; and Shellfish-derived products  Home Medications   Prior to Admission medications   Medication Sig Start Date End Date Taking? Authorizing Provider  ARIPiprazole (ABILIFY) 5 MG tablet Take 1 tablet (5 mg total) by mouth at bedtime. 10/23/15  Yes Truman Hayward, FNP  albuterol (PROVENTIL HFA;VENTOLIN HFA) 108 (90 BASE) MCG/ACT inhaler Inhale 2 puffs into  the lungs every 4 (four) hours as needed for wheezing or shortness of breath. 10/23/15   Truman Hayward, FNP   BP 111/63 mmHg  Pulse 80  Temp(Src) 98.2 F (36.8 C) (Oral)  Resp 16  SpO2 96% Physical Exam  Constitutional: He is oriented to person, place, and time. He appears well-developed.  HENT:  Head: Atraumatic.  Neck: Neck supple.  Cardiovascular: Normal rate.   Pulmonary/Chest: Effort normal.  Neurological: He is alert and oriented to person, place, and time. No cranial nerve deficit. Coordination normal.  Skin: Skin is warm.  Psychiatric: Judgment and thought content normal.  Flat affect  Nursing note and vitals reviewed.   ED Course  Procedures (including critical care time) Labs Review Labs Reviewed  CBC - Abnormal; Notable for the following:    WBC 11.4 (*)    All other components within normal limits  COMPREHENSIVE METABOLIC PANEL  URINE RAPID DRUG SCREEN, HOSP PERFORMED  ETHANOL  SALICYLATE LEVEL  ACETAMINOPHEN LEVEL    Imaging Review No results found. I have personally reviewed and evaluated these images and lab results as part of my medical decision-making.   EKG Interpretation None      MDM   Final diagnoses:  Suicidal ideations   DDx: Depression Bipolar disorder Schizophrenia Substance abuse Suicidal ideation Acute withdrawal  Pt is voluntary.  He has some suicidal thoughts. Pt is taking his meds as prescribed. Medically cleared for tts eval.  Derwood Kaplan, MD 01/23/16 1551  7:26 PM Spoke with Dr. Sherene Sires, Critical Care -  pt given the presentation on the patient, the AMS, the pH 6.99 and low temp. Informed him that she is now orienting. He requests that pt be admitted by hospitalist.    Derwood Kaplan, MD 01/23/16 [phone]

## 2016-01-23 NOTE — BH Assessment (Signed)
Patient accepted to OBS Bed 3 per [facility]) [name].  Patient signed voluntary consent form.  Writer faxed voluntary consent form to [facility].

## 2016-01-23 NOTE — BH Assessment (Addendum)
Assessment Note  [name] is an 30 y.o. male with history of Bipolar I Disorder. He presents to [facility] for a evaluation. Patient was transported to [facility] by [employer]. Patient is voluntary. He reports a long history of depression "off and on". Today he had an appointment with his therapist at "[facility]". Patient told his therapist that he had suicidal thoughts with a plan to walk in traffic. Patient was also unable to contract for safety. He continues to have current suicidal thoughts. His current suicidal thoughts are triggered by, "People labeling me because I have a disability".Patient feels that others feel as if they are inferior to him. He doesn't feel that he is treated fairly in the community. He has not tried to commit suicide in the past. Patient does have a history of self mutilating behaviors such as "hitting head on wall". Sts that 2 weeks ago he punched himself in the jaw several times.  He reports depressive symptoms such has hopelessness, crying spells, and loss of interest in usual pleasures. Patients appetite and sleep are both good. He denies HI and AVH's. Patient reports 1 previous hospitalization at [facility] 08/2015. No alcohol and drug use. Patient reports his parents as a support system for him.   Diagnosis: Bipolar I Disorder   Past Medical History:  Past Medical History  Diagnosis Date  . Bipolar 1 disorder (HCC)   . High cholesterol     Past Surgical History  Procedure Laterality Date  . Pilonidal cyst excision    . Ganglion cyst excision      Family History: History reviewed. No pertinent family history.  Social History:  reports that he has never smoked. He has never used smokeless tobacco. He reports that he does not drink alcohol or use illicit drugs.  Additional Social History:     CIWA: CIWA-Ar BP: 111/63 mmHg Pulse Rate: 80 COWS:    Allergies:  Allergies  Allergen Reactions  . Aspirin     Bleeding in urine  . Ibuprofen     "bleeding in urine"  .  Metformin And Related Diarrhea    Leg muscles became weak  . Ritalin [Methylphenidate Hcl]     Suicidal thoughts  . Shellfish-Derived Products Hives    Home Medications:  (Not in a hospital admission)  OB/GYN Status:  No LMP for male patient.  General Assessment Data Location of Assessment: [facility] ED TTS Assessment: In system Is this a Tele or Face-to-Face Assessment?: Tele Assessment Is this an Initial Assessment or a Re-assessment for this encounter?: Initial Assessment Marital status: Single Maiden name:  (n/a) Is patient pregnant?: No Pregnancy Status: No Living Arrangements: Other (Comment), Parent, Other relatives (mom, dad, and sister) Can pt return to current living arrangement?: Yes Is patient capable of signing voluntary admission?: Yes Insurance type:  (Medicaid )  Medical Screening Exam [facility] Walk-in ONLY) Medical Exam completed: No  Crisis Care Plan Living Arrangements: Other (Comment), Parent, Other relatives (mom, dad, and sister) Legal Guardian:  (no guardian ) Name of Psychiatrist: Education officer, community Name of Therapist: [facility]  Education Status Is patient currently in school?: No Highest grade of school patient has completed: GED  Risk to self with the past 6 months Suicidal Ideation: Yes-Currently Present Has patient been a risk to self within the past 6 months prior to admission? : Yes Suicidal Intent: Yes-Currently Present Has patient had any suicidal intent within the past 6 months prior to admission? : Yes Is patient at risk for suicide?: Yes Suicidal Plan?: Yes-Currently Present Has  patient had any suicidal plan within the past 6 months prior to admission? : Yes Specify Current Suicidal Plan:  (walk into traffic) Access to Means: Yes (access to traffic ) Specify Access to Suicidal Means:  (traffic ) Previous Attempts/Gestures: No How many times?:  (0) Other Self Harm Risks:  (punch self in jaw 2weeks ago; head butt wall ) Triggers for Past Attempts: Other  (Comment) (grandparents died) Intentional Self Injurious Behavior:  (hit self in the head with fist or head butt wall ) Family Suicide History: No Recent stressful life event(s): Other (Comment) ("Because I'm labled special needs I'm treated inferior) Persecutory voices/beliefs?: No Depression: Yes Depression Symptoms: Feeling worthless/self pity, Loss of interest in usual pleasures, Isolating, Tearfulness Substance abuse history and/or treatment for substance abuse?: No Suicide prevention information given to non-admitted patients: Not applicable  Risk to Others within the past 6 months Homicidal Ideation: No Does patient have any lifetime risk of violence toward others beyond the six months prior to admission? : No Thoughts of Harm to Others: No Current Homicidal Intent: No Current Homicidal Plan: No Access to Homicidal Means: Yes Describe Access to Homicidal Means:  ("I a couple of swords and tazers") Identified Victim:  (n/a) History of harm to others?: No Assessment of Violence: None Noted Violent Behavior Description:  (patient calm and cooperative ) Does patient have access to weapons?: No Criminal Charges Pending?: No Does patient have a court date: No Is patient on probation?: No     Mental Status Report Appearance/Hygiene: Unremarkable, In scrubs Speech: Logical/coherent Level of Consciousness: Alert Affect: Appropriate to circumstance Orientation: Person, Place, Time, Situation, Appropriate for developmental age  Cognitive Functioning Concentration: Normal Memory: Recent Intact, Remote Intact IQ: Average Insight: Good Impulse Control: Fair Appetite: Good Weight Loss:  (none reported) Weight Gain:  (none reported) Sleep: No Change Total Hours of Sleep:  (8 hrs per night ) Vegetative Symptoms: None  ADLScreening [facility] Assessment Services) Patient's cognitive ability adequate to safely complete daily activities?: Yes Patient able to express need for  assistance with ADLs?: Yes Independently performs ADLs?: Yes (appropriate for developmental age)  Prior Inpatient Therapy Prior Inpatient Therapy: Yes Prior Therapy Dates: 10/2015- most recent Prior Therapy Facilty/Provider(s): [facility] Reason for Treatment: Depression  Prior Outpatient Therapy Prior Outpatient Therapy: Yes Prior Therapy Dates: March 2016- Present Prior Therapy Facilty/Provider(s): [facility] Reason for Treatment: PSR/Therapy - Depression Does patient have an ACCT team?: No Does patient have Intensive In-House Services?  : No Does patient have [facility] services? : No Does patient have P4CC services?: No  ADL Screening (condition at time of admission) Patient's cognitive ability adequate to safely complete daily activities?: Yes Patient able to express need for assistance with ADLs?: Yes Independently performs ADLs?: Yes (appropriate for developmental age)             Merchant navy officer (For Healthcare) Does patient have an advance directive?: No    Additional Information 1:1 In Past 12 Months?: No CIRT Risk: No Elopement Risk: No Does patient have medical clearance?: Yes     Disposition:  Disposition Initial Assessment Completed for this Encounter: Yes Other disposition(s):  (observe overnight)  On Site Evaluation by:   Reviewed with Physician:    Octaviano Batty 01/23/2016 4:30 PM

## 2016-01-23 NOTE — ED Notes (Signed)
On the phone 

## 2016-01-23 NOTE — ED Notes (Signed)
Bed: WBH37 Expected date:  Expected time:  Means of arrival:  Comments: TR4 

## 2016-01-24 ENCOUNTER — Observation Stay (HOSPITAL_COMMUNITY)
Admission: AD | Admit: 2016-01-24 | Discharge: 2016-01-25 | Disposition: A | Discharge status: Home / Self Care | Payer: Medicaid Other | Source: Intra-hospital | Attending: Psychiatry | Admitting: Psychiatry

## 2016-01-24 ENCOUNTER — Encounter (HOSPITAL_COMMUNITY): Payer: Self-pay | Admitting: Behavioral Health

## 2016-01-24 DIAGNOSIS — F250 Schizoaffective disorder, bipolar type: Secondary | ICD-10-CM | POA: Diagnosis not present

## 2016-01-24 DIAGNOSIS — F603 Borderline personality disorder: Secondary | ICD-10-CM | POA: Insufficient documentation

## 2016-01-24 DIAGNOSIS — Z886 Allergy status to analgesic agent status: Secondary | ICD-10-CM | POA: Diagnosis not present

## 2016-01-24 DIAGNOSIS — R45851 Suicidal ideations: Secondary | ICD-10-CM | POA: Insufficient documentation

## 2016-01-24 DIAGNOSIS — Z91018 Allergy to other foods: Secondary | ICD-10-CM | POA: Insufficient documentation

## 2016-01-24 DIAGNOSIS — F319 Bipolar disorder, unspecified: Secondary | ICD-10-CM

## 2016-01-24 DIAGNOSIS — Z888 Allergy status to other drugs, medicaments and biological substances status: Secondary | ICD-10-CM | POA: Insufficient documentation

## 2016-01-24 MED ORDER — ARIPIPRAZOLE 5 MG PO TABS: 5.0000 mg | ORAL_TABLET | Freq: Every day | ORAL | Status: DC

## 2016-01-24 MED ORDER — TRAZODONE HCL 50 MG PO TABS: 50.0000 mg | ORAL_TABLET | Freq: Every evening | ORAL | Status: DC | PRN

## 2016-01-24 MED ORDER — ACETAMINOPHEN 325 MG PO TABS: 650.0000 mg | ORAL_TABLET | Freq: Four times a day (QID) | ORAL | Status: DC | PRN

## 2016-01-24 MED ORDER — MAGNESIUM HYDROXIDE 400 MG/5ML PO SUSP: 30.0000 mL | Freq: Every day | ORAL | Status: DC | PRN

## 2016-01-24 MED ORDER — HYDROXYZINE HCL 25 MG PO TABS: 25.0000 mg | ORAL_TABLET | Freq: Three times a day (TID) | ORAL | Status: DC | PRN

## 2016-01-24 MED ORDER — ARIPIPRAZOLE 10 MG PO TABS
10.0000 mg | ORAL_TABLET | Freq: Every day | ORAL | Status: DC
  Administered 2016-01-24 – 2016-01-25 (×2): 10 mg via ORAL
  Filled 2016-01-24 (×2): qty 1

## 2016-01-24 MED ORDER — ALUM & MAG HYDROXIDE-SIMETH 200-200-20 MG/5ML PO SUSP: 30.0000 mL | ORAL | Status: DC | PRN

## 2016-01-24 MED ORDER — ALBUTEROL SULFATE HFA 108 (90 BASE) MCG/ACT IN AERS: 2.0000 | INHALATION_SPRAY | RESPIRATORY_TRACT | Status: DC | PRN

## 2016-01-24 NOTE — H&P (Signed)
[facility] Admission Assessment Adult  Patient Identification: [name] MRN:  [id number] Date of Evaluation:  01/24/2016 Chief Complaint: Patient states "I want my mood to be controlled."  Principal Diagnosis:  Suicidal ideations Diagnosis:   Patient Active Problem List   Diagnosis Date Noted  . Bipolar 1 disorder ([place]) [F31.9] 01/24/2016  . Suicidal ideations [R45.851]   . Schizoaffective disorder, depressive type ([place]) [F25.1] 10/18/2015  . Schizoaffective disorder, bipolar type ([place]) [F25.0] 10/18/2015  . Suicidal ideation [R45.851]    History of Present Illness::  [name] is a 30 year old male with a history of Bipolar 1 Disorder. He was transported to [facility] by [facility] for an evaluation. The patient had told his therapist that he was suicidal with a plan to walk into traffic. [name] was last admitted to [facility] in November 2016 for management of Bipolar Disorder for which he reports being started on Abilify. Patient stated during the assessment today "I get depressed and manic. When I get happy it lasts a while. I am tired of my mood fluctuating. I was not able to stay on Abilify 5 mg daily because I felt that it was not helping me. I feel put down by people in my life because of my psychiatric diagnosis. I am not feeling as suicidal. 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 I can't get along with him. I get very frustrated because of my mood. My mood gets very unstable." [name] reports that his medication needs an adjustment and his next outpatient appointment is not until February 14, 2016. The patient presents as mildly depressed during the assessment but not as manic or psychotic. He did not stay on the Abilify but for a month. It is not clear why he did not ask his outpatient Psychiatrist to adjust his medication as he reports it was not helping. The patient is open to having a medication adjustment in the Observation Unit. Patient denies any significant  neuro-vegetative symptoms of depression at this time. His main concern appears to be mood instability. The patient had basic metabolic lab-work completed during his last admission to include lipid panel with mild increase in LDL, Hemglobin A1c 6.1, and fasting blood glucose. Will not repeat at this time as the patient has not been actively taking the Abilify since late last year.  Associated Signs/Symptoms: Depression Symptoms:  suicidal thoughts with specific plan, of note, denies symptoms of depression, denies anhedonia, denies changes in energy, appetite, sleep, describes good sense of self esteem. (Hypo) Manic Symptoms:   Denies recently  Anxiety Symptoms:  Denies anxiety symptoms Psychotic Symptoms:   Denies any history of psychosis - denies hallucinations. PTSD Symptoms: Denies  Total Time spent with patient:  45 minutes   Past Psychiatric History: prior admissions but not in several years. Last admission 5 years ago or so.History of one prior suicide by overdosing on acetaminophen in 2009. Denies any history of psychosis. States he has been diagnosed with Bipolar Disorder in the past  And with Borderline Personality Disorder.At this time , however,  Denies any clear history of mania or hypomania.  Denies history of violence .  States he remembers being on Depakote ER for several years, but had stopped a few years ago. States that prior to admission he had not been on any psychiatric medications .     Risk to Self: Is patient at risk for suicide?: Yes Risk to Others:   Prior Inpatient Therapy:   Prior Outpatient Therapy:  Alcohol Screening: 1. How often do you have a drink containing alcohol?: Never 9. Have you or someone else been injured as a result of your drinking?: No 10. Has a relative or friend or a doctor or another health worker been concerned about your drinking or suggested you cut down?: No Alcohol Use Disorder Identification Test Final Score (AUDIT): 0 Brief  Intervention: AUDIT score less than 7 or less-screening does not suggest unhealthy drinking-brief intervention not indicated Substance Abuse History in the last 12 months:   Denies any alcohol abuse, denies any drug abuse  Consequences of Substance Abuse:  denies  Previous Psychotropic Medications:  Remembers being on Depakote ER in the past - not recently. Of note, chart notes indicate Risperidone and [name] as prior medications, but  patient states he was not taking any psychiatric medications prior to admission. It appears that  , as per patient, IM antipsychotic was last given years ago/ not recently Psychological Evaluations - no Past Medical History:  Denies any medical illness, allergic to Metformin, Ibuprofen, does not smoke Past Medical History  Diagnosis Date  . Bipolar 1 disorder ([place])   . High cholesterol     Past Surgical History  Procedure Laterality Date  . Pilonidal cyst excision    . Ganglion cyst excision     Family History:  Parents alive , live together, in [place], has one sister and an adopted brother. Family Psychiatric  History:  States he thinks his mother may be bipolar, sister has mild MR . No suicides in family, one uncle is alcoholic . Social History: Single, no children,  lives with his parents , currently enrolled on  PSR program- [employer]. On disability- mother is payee. Got GED . Denies legal issues . States his GF passed away earlier this year.  History  Alcohol Use No     History  Drug Use No    Social History   Social History  . Marital Status: Single    Spouse Name: N/A  . Number of Children: N/A  . Years of Education: N/A   Social History Main Topics  . Smoking status: Never Smoker   . Smokeless tobacco: Never Used  . Alcohol Use: No  . Drug Use: No  . Sexual Activity: No   Other Topics Concern  . None   Social History Narrative   Additional Social History:  Allergies:   Allergies  Allergen Reactions  . Aspirin      Bleeding in urine  . Ibuprofen     "bleeding in urine"  . Metformin And Related Diarrhea    Leg muscles became weak  . Other     Mushrooms   . Ritalin [Methylphenidate Hcl]     Suicidal thoughts  . Shellfish-Derived Products Hives   Lab Results:  Results for orders placed or performed during the hospital encounter of 01/23/16 (from the past 48 hour(s))  Urine rapid drug screen (hosp performed) (Not at [facility])     Status: None   Collection Time: 01/23/16  2:56 PM  Result Value Ref Range   Opiates NONE DETECTED NONE DETECTED   Cocaine NONE DETECTED NONE DETECTED   Benzodiazepines NONE DETECTED NONE DETECTED   Amphetamines NONE DETECTED NONE DETECTED   Tetrahydrocannabinol NONE DETECTED NONE DETECTED   Barbiturates NONE DETECTED NONE DETECTED    Comment:        DRUG SCREEN FOR MEDICAL PURPOSES ONLY.  IF CONFIRMATION IS NEEDED FOR ANY PURPOSE, NOTIFY LAB WITHIN 5 DAYS.  LOWEST DETECTABLE LIMITS FOR URINE DRUG SCREEN Drug Class       Cutoff (ng/mL) Amphetamine      1000 Barbiturate      200 Benzodiazepine   371 Tricyclics       696 Opiates          300 Cocaine          300 THC              50   Comprehensive metabolic panel     Status: None   Collection Time: 01/23/16  3:12 PM  Result Value Ref Range   Sodium 139 135 - 145 mmol/L   Potassium 3.9 3.5 - 5.1 mmol/L   Chloride 106 101 - 111 mmol/L   CO2 25 22 - 32 mmol/L   Glucose, Bld 98 65 - 99 mg/dL   BUN 10 6 - 20 mg/dL   Creatinine, Ser 1.05 0.61 - 1.24 mg/dL   Calcium 9.4 8.9 - 10.3 mg/dL   Total Protein 7.6 6.5 - 8.1 g/dL   Albumin 4.4 3.5 - 5.0 g/dL   AST 23 15 - 41 U/L   ALT 23 17 - 63 U/L   Alkaline Phosphatase 61 38 - 126 U/L   Total Bilirubin 0.9 0.3 - 1.2 mg/dL   GFR calc non Af Amer >60 >60 mL/min   GFR calc Af Amer >60 >60 mL/min    Comment: (NOTE) The eGFR has been calculated using the CKD EPI equation. This calculation has not been validated in all clinical situations. eGFR's persistently <60  mL/min signify possible Chronic Kidney Disease.    Anion gap 8 5 - 15  Ethanol (ETOH)     Status: None   Collection Time: 01/23/16  3:12 PM  Result Value Ref Range   Alcohol, Ethyl (B) <5 <5 mg/dL    Comment:        LOWEST DETECTABLE LIMIT FOR SERUM ALCOHOL IS 5 mg/dL FOR MEDICAL PURPOSES ONLY   Salicylate level     Status: None   Collection Time: 01/23/16  3:12 PM  Result Value Ref Range   Salicylate Lvl <7.8 2.8 - 30.0 mg/dL  Acetaminophen level     Status: Abnormal   Collection Time: 01/23/16  3:12 PM  Result Value Ref Range   Acetaminophen (Tylenol), Serum <10 (L) 10 - 30 ug/mL    Comment:        THERAPEUTIC CONCENTRATIONS VARY SIGNIFICANTLY. A RANGE OF 10-30 ug/mL MAY BE AN EFFECTIVE CONCENTRATION FOR MANY PATIENTS. HOWEVER, SOME ARE BEST TREATED AT CONCENTRATIONS OUTSIDE THIS RANGE. ACETAMINOPHEN CONCENTRATIONS >150 ug/mL AT 4 HOURS AFTER INGESTION AND >50 ug/mL AT 12 HOURS AFTER INGESTION ARE OFTEN ASSOCIATED WITH TOXIC REACTIONS.   CBC     Status: Abnormal   Collection Time: 01/23/16  3:12 PM  Result Value Ref Range   WBC 11.4 (H) 4.0 - 10.5 K/uL   RBC 5.06 4.22 - 5.81 MIL/uL   Hemoglobin 13.2 13.0 - 17.0 g/dL   HCT 41.2 39.0 - 52.0 %   MCV 81.4 78.0 - 100.0 fL   MCH 26.1 26.0 - 34.0 pg   MCHC 32.0 30.0 - 36.0 g/dL   RDW 13.6 11.5 - 15.5 %   Platelets 393 150 - 938 K/uL    Metabolic Disorder Labs:  Lab Results  Component Value Date   HGBA1C 6.1* 10/20/2015   MPG 128 10/20/2015   No results found for: PROLACTIN Lab Results  Component Value Date   CHOL 168 10/20/2015  TRIG 71 10/20/2015   HDL 38* 10/20/2015   CHOLHDL 4.4 10/20/2015   VLDL 14 10/20/2015   LDLCALC 116* 10/20/2015    Current Medications: Current Facility-Administered Medications  Medication Dose Route Frequency Provider Last Rate Last Dose  . acetaminophen (TYLENOL) tablet 650 mg  650 mg Oral Q6H PRN Harriet Butte, NP      . albuterol (PROVENTIL HFA;VENTOLIN HFA) 108 (90  Base) MCG/ACT inhaler 2 puff  2 puff Inhalation Q4H PRN Harriet Butte, NP      . alum & mag hydroxide-simeth (MAALOX/MYLANTA) 200-200-20 MG/5ML suspension 30 mL  30 mL Oral Q4H PRN Harriet Butte, NP      . ARIPiprazole (ABILIFY) tablet 10 mg  10 mg Oral Daily Niel Hummer, NP   10 mg at 01/24/16 4235  . hydrOXYzine (ATARAX/VISTARIL) tablet 25 mg  25 mg Oral TID PRN Harriet Butte, NP      . magnesium hydroxide (MILK OF MAGNESIA) suspension 30 mL  30 mL Oral Daily PRN Harriet Butte, NP      . traZODone (DESYREL) tablet 50 mg  50 mg Oral QHS PRN Harriet Butte, NP       PTA Medications: Prescriptions prior to admission  Medication Sig Dispense Refill Last Dose  . albuterol (PROVENTIL HFA;VENTOLIN HFA) 108 (90 BASE) MCG/ACT inhaler Inhale 2 puffs into the lungs every 4 (four) hours as needed for wheezing or shortness of breath. 1 Inhaler 0 More than a month at Unknown time  . ARIPiprazole (ABILIFY) 5 MG tablet Take 1 tablet (5 mg total) by mouth at bedtime. 30 tablet 0 More than a month at Unknown time    Musculoskeletal: Strength & Muscle Tone: within normal limits Gait & Station: normal Patient leans: N/A  Psychiatric Specialty Exam: Physical Exam  Review of Systems  Constitutional: Negative.   HENT: Negative.   Eyes: Negative.   Respiratory: Negative.   Cardiovascular: Negative.   Gastrointestinal: Negative.   Genitourinary: Negative.   Musculoskeletal: Negative.   Skin: Negative.   Neurological: Negative.  Negative for seizures.  Endo/Heme/Allergies: Negative.   Psychiatric/Behavioral: Positive for depression and suicidal ideas. Negative for hallucinations, memory loss and substance abuse. The patient is not nervous/anxious and does not have insomnia.   All other systems reviewed and are negative.   Blood pressure 120/55, pulse 74, temperature 97.7 F (36.5 C), temperature source Oral, resp. rate 20, height 6' 1"  (1.854 m), SpO2 97 %.There is no weight on file to  calculate BMI.  General Appearance: Casual  Eye Contact::  Good  Speech:  Normal Rate  Volume:  Normal  Mood:  states that today " I feel OK", not depressed   Affect:  mildly constricted  Thought Process:  Linear  Orientation:  Other:  fully alert and attentive   Thought Content:  denies hallucinations, no delusions   Suicidal Thoughts:  No denies any current suicidal ideations, denies any thoughts of hurting  Self or anyone else   Homicidal Thoughts:  No  Memory:  recent and remote grossly intact   Judgement:  Fair  Insight:  Fair  Psychomotor Activity:  Normal  Concentration:  Good  Recall:  Good  Fund of Knowledge:Good  Language: Good  Akathisia:  Negative  Handed:  Right  AIMS (if indicated):     Assets:  Desire for Improvement Physical Health Resilience Social Support  ADL's:  Intact  Cognition: WNL  Sleep:        Treatment Plan Summary: Daily  contact with patient to assess and evaluate symptoms and progress in treatment, Medication management, Plan inpatient admission and medications as below  Observation Level/Precautions:  Continuous Observation  Laboratory: UDS negative, Chemistry panel, CBC WNL  Psychotherapy:  Milieu, groups   Medications:  Increase Abilify to 10 mg daily for mood stabilization   Consultations: As needed     Discharge Concerns:  Mood stability   Estimated LOS: Less than 48 hours  Other:  Possible discharge tomorrow if patient denies suicidal thoughts    Elmarie Shiley, NP-C 2/24/20171:14 PM

## 2016-01-24 NOTE — Progress Notes (Signed)
[facility] INPATIENT:  Family/Significant Other Suicide Prevention Education  Suicide Prevention Education:  Patient Refusal for Family/Significant Other Suicide Prevention Education: The patient [name] has refused to provide written consent for family/significant other to be provided Family/Significant Other Suicide Prevention Education during admission and/or prior to discharge.    Angeline Slim M 01/24/2016, 12:54 AM

## 2016-01-24 NOTE — Progress Notes (Signed)
D:Patient states he has a good day.  Patient stats his parents did not answer the phone because they were at church tonight.  Patient plans on being discharged tomorrow.  Patient states he was started back on Abilify and hopes to see some changes.  Patient denies SI/HI and denies AVH. A: Staff to monitor Q 15 mins for safety.  Encouragement and support offered. No scheduled medications administered per orders.   R: Patient remains safe on the unit.

## 2016-01-24 NOTE — Progress Notes (Signed)
Nursing Shift Assessment:  Patient awakening as Nurse cleaning up after his breakfast tray, noting patient eating almost 100% of breakfast. Patient denies any SI/HI/AVH and rates depression, anxiety, and pain at zero. Patient denies any complaints. Patient states that there is only one staff member he has an issue with at his outpatient provider facility and has another person he is seeing at present. Nurse checking patient's orders, providing therapeutic communication, emotional support, as well as continuous observation for safety except when patient in bathroom. Patient remains safe on Unit, voices no needs or complaints.

## 2016-01-24 NOTE — Progress Notes (Signed)
Patient admitted voluntarily to the Observation unit.  Patient states he was originally having self harm thoughts with no plan noted.  Patient states now the thoughts are gone and thought he would be discharged tonight.  Patient states he was at [facility] in November and at that time was prescribed Abilify.  Patient states he has not taken the Abilify since then.  Patient states he lives at home with his mother, father and sister.  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  Patient states he constantly feels that people pick on him and belittle him.  Patient currently denies SI/HI and denies AVH.  Patient belongings secured in locker #4.  Patient oriented to the unit.

## 2016-01-24 NOTE — Progress Notes (Signed)
D: Patient resting in bed with eyes closed.  Respirations even and unlabored.  Patient appears to be in no apparent distress. A: Staff to monitor Q 15 mins for safety.   R:Patient remains safe on the unit.  

## 2016-01-24 NOTE — Progress Notes (Signed)
Nursing Progress Note:  Fransisca Kaufmann, NP in to see patient; she and Dianne Dun, Counselor at patient's bedside gathering additional background and issues according to patient. Charisse March NP letting Nurse know she may increase patient's Abilify dosage. Will regularly check patient's orders so as no delay in implementation.

## 2016-01-24 NOTE — Progress Notes (Signed)
[facility] Observation Crisis Plan  Reason for Crisis Plan:  Medication Management   Plan of Care:    Family Support:      Current Living Environment:  Living Arrangements: Parent, Other relatives  Insurance:   Hospital Account    Name Acct ID Class Status Primary Coverage   Andrews, Tener [id number] BEHAVIORAL HEALTH OBSERVATION [place]        Guarantor Account (for Hospital Account [id number])    Name Relation to Pt Service Area Active? Acct Type   [name] Self [facility] Yes Behavioral Health   Address Phone       [address] [place], [place] [address] [phone](H)          Coverage Information (for Hospital Account [id number])    F/O Payor/Plan Precert #   [facility] [place]    Subscriber Subscriber #   Bryen, Hinderman [id number]   Address Phone   [address] [place], [place] [address] [phone]      Legal Guardian:     Primary Care Provider:  Jolene Provost, MD  Current Outpatient Providers:    Psychiatrist:     Counselor/Therapist:     Compliant with Medications:  No  Additional Information:   Gildardo Griffes 2/24/201712:55 AM

## 2016-01-24 NOTE — BH Assessment (Signed)
[facility] Assessment Progress Note  Patient was seen this date by Earlene Plater NP and Crist Fat LCAS to discuss disposition and treatment options. Patient denies any S/I or H/I but states he continues to exhibit symptoms associated with depression and Bi-polar swings. Patient has been taking  of Abilify for symptoms but admits discontinuing the medication two weeks ago since patient felt the medication was not working. Davis NP after speaking with patient, agreed to increase his dose of Abilify to  and continue to monitor patient's progress in the Observational Unit. Patient asked this writer to speak to his mother [name] [phone]) to inform her of patient's current status and treatment plan. Collateral gathered from mother stated patient has been doing well at home and [facility] although mother did state she noticed increased mood swings since patient discontinued his medication/s. Patient will continue to be observed in OBS's as medication/s are adjusted.

## 2016-01-25 DIAGNOSIS — F319 Bipolar disorder, unspecified: Secondary | ICD-10-CM | POA: Diagnosis not present

## 2016-01-25 DIAGNOSIS — F250 Schizoaffective disorder, bipolar type: Secondary | ICD-10-CM | POA: Diagnosis not present

## 2016-01-25 MED ORDER — TRAZODONE HCL 50 MG PO TABS: 50.0000 mg | ORAL_TABLET | Freq: Every evening | ORAL | Status: DC | PRN

## 2016-01-25 MED ORDER — ARIPIPRAZOLE 10 MG PO TABS: 10.0000 mg | ORAL_TABLET | Freq: Every day | ORAL | Status: DC

## 2016-01-25 NOTE — Discharge Instructions (Signed)
Patient is to follow up with [facility] on February 14, 2016 for therapy and medication management.

## 2016-01-25 NOTE — Progress Notes (Signed)
Nursing Shift Assessment:  Patient cooperative, pleasant, appropriate and logical in conversation; mood is upbeat and behavior is appropriate. Pt denies SI/HI/AVH, depressioin, anxiety, or pain. Patient states he is ready to be discharged today and is happy about that and "ready". Nurse administering medications as ordered and providing therapeutic communication as well as emotional support. Nurse ensuring patient is observed at all times except when in bathroom. Patient remains safe on Unit.

## 2016-01-25 NOTE — Discharge Summary (Signed)
[facility]-Observation Unit Discharge Summary Note  Patient:  [name] is an 30 y.o., male MRN:  [id number] DOB:  November 20, 1986 Patient phone:  [phone] (home)  Patient address:   [address] [place] [address],  Total Time spent with patient: 30 minutes  Date of Admission:  01/24/2016 Date of Discharge: 01/25/2016  Reason for Admission: Mood stabilization   Principal Problem: Bipolar 1 disorder [facility]) Discharge Diagnoses: Patient Active Problem List   Diagnosis Date Noted  . Bipolar 1 disorder (HCC) [F31.9] 01/24/2016  . Suicidal ideations [R45.851]   . Schizoaffective disorder, depressive type (HCC) [F25.1] 10/18/2015  . Schizoaffective disorder, bipolar type (HCC) [F25.0] 10/18/2015  . Suicidal ideation [R45.851]     Past Psychiatric History: prior admissions but not in several years. Last admission 5 years ago or so.History of one prior suicide by overdosing on acetaminophen in 2009. Denies any history of psychosis. States he has been diagnosed with Bipolar Disorder in the past And with Borderline Personality Disorder.At this time , however, Denies any clear history of mania or hypomania.  Denies history of violence .  States he remembers being on Depakote ER for several years, but had stopped a few years ago. States that prior to admission he had not been on any psychiatric medications .   Past Medical History:  Past Medical History  Diagnosis Date  . Bipolar 1 disorder (HCC)   . High cholesterol     Past Surgical History  Procedure Laterality Date  . Pilonidal cyst excision    . Ganglion cyst excision     Family History: History reviewed. No pertinent family history. Family Psychiatric  History: See HPI Social History:  History  Alcohol Use No     History  Drug Use No    Social History   Social History  . Marital Status: Single    Spouse Name: N/A  . Number of Children: N/A  . Years of Education: N/A   Social History Main Topics  . Smoking  status: Never Smoker   . Smokeless tobacco: Never Used  . Alcohol Use: No  . Drug Use: No  . Sexual Activity: No   Other Topics Concern  . None   Social History Narrative    Hospital Course:  [name] was admitted for Bipolar 1 disorder [facility]) and crisis management. He was treated discharged with the medications listed below under Medication List.  Medical problems were identified and treated as needed.  Home medications were restarted as appropriate. Patient on admission to the [facility]-Observation unit reported that his previous dose of Abilify 5 mg was not helping stabilize his mood so he had quit taking it last year. The patient was agreeable to taking a higher dose of the medication. Hurbert's Abilify was increased to 10 mg daily, which he tolerated well.   Improvement was monitored by observation and [name] daily report of symptom reduction.  Emotional and mental status was monitored by daily self-inventory reports completed by [name] and clinical staff. There was no behavior problems demonstrated by the patient during his stay. He consistently began to deny any suicidal thoughts and appeared motivated to comply with his treatment. His parents were contacted for collateral information from his parents who the patient reported that he lived with. His mother reported patient has experienced an increase in mood swings since discontinuing the medication.        [name] was evaluated by the treatment team for stability and plans for continued recovery upon discharge.  [name] motivation was an integral factor for scheduling further treatment.  Employment, transportation, bed availability, health status, family support, and any pending legal issues were also considered during his hospital stay.  He was offered further treatment options upon discharge including  Outpatient treatment.  [name] will follow up with the services as listed below under Follow Up  Information.  Patient reports having his next outpatient appointment February 14, 2016. He was provided with a thirty day prescription for his medications so that he would not run out prior to his appointment. The patient reported having ability to pay for his medications due to having medicaid. Patient also advised to follow up with a Primary Care Provider to ensure proper monitoring is performed to detect any metabolic issues from taking a second generation antipsychotic medication. This include routine monitoring of fasting blood glucose and lipid panel for example.   Upon completion of this admission the patient was both mentally and medically stable for discharge denying suicidal/homicidal ideation, auditory/visual/tactile hallucinations, delusional thoughts and paranoia.      Physical Findings: AIMS: Facial and Oral Movements Muscles of Facial Expression: None, normal Lips and Perioral Area: None, normal Jaw: None, normal Tongue: None, normal,Extremity Movements Upper (arms, wrists, hands, fingers): None, normal Lower (legs, knees, ankles, toes): None, normal, Trunk Movements Neck, shoulders, hips: None, normal, Overall Severity Severity of abnormal movements (highest score from questions above): None, normal Incapacitation due to abnormal movements: None, normal, Dental Status Current problems with teeth and/or dentures?: No Does patient usually wear dentures?: No  CIWA:  CIWA-Ar Total: 0 COWS:     Musculoskeletal: Strength & Muscle Tone: within normal limits Gait & Station: normal Patient leans: N/A  Psychiatric Specialty Exam: See MD SRA Review of Systems  Constitutional: Negative.   HENT: Negative.   Eyes: Negative.   Respiratory: Negative.   Cardiovascular: Negative.   Gastrointestinal: Negative.   Genitourinary: Negative.   Musculoskeletal: Negative.   Skin: Negative.   Neurological: Negative.   Endo/Heme/Allergies: Negative.   Psychiatric/Behavioral: Positive for  depression. Negative for suicidal ideas, hallucinations and substance abuse. The patient is nervous/anxious and has insomnia.   All other systems reviewed and are negative.   Blood pressure 105/58, pulse 74, temperature 97.8 F (36.6 C), temperature source Oral, resp. rate 20, height  (1.854 m), SpO2 98 %.There is no weight on file to calculate BMI.   Have you used any form of tobacco in the last 30 days? (Cigarettes, Smokeless Tobacco, Cigars, and/or Pipes): No  Has this patient used any form of tobacco in the last 30 days? (Cigarettes, Smokeless Tobacco, Cigars, and/or Pipes) Yes, Yes, A prescription for an FDA-approved tobacco cessation medication was offered at discharge and the patient refused  Metabolic Disorder Labs:  Lab Results  Component Value Date   HGBA1C 6.1* 10/20/2015   MPG 128 10/20/2015   No results found for: PROLACTIN Lab Results  Component Value Date   CHOL 168 10/20/2015   TRIG 71 10/20/2015   HDL 38* 10/20/2015   CHOLHDL 4.4 10/20/2015   VLDL 14 10/20/2015   LDLCALC 116* 10/20/2015    Discharge destination:  Home  Is patient on multiple antipsychotic therapies at discharge:  No   Has Patient had three or more failed trials of antipsychotic monotherapy by history:  No  Recommended Plan for Multiple Antipsychotic Therapies: NA     Medication List    TAKE these medications      Indication   albuterol 108 (90 Base) MCG/ACT inhaler  Commonly known as:  PROVENTIL HFA;VENTOLIN HFA  Inhale 2 puffs into the lungs every 4 (four) hours as needed for wheezing or shortness of breath.      ARIPiprazole 10 MG tablet  Commonly known as:  ABILIFY  Take 1 tablet (10 mg total) by mouth daily.   Indication:  Manic Phase of Manic-Depression     traZODone 50 MG tablet  Commonly known as:  DESYREL  Take 1 tablet (50 mg total) by mouth at bedtime as needed for sleep (may repeat x1).   Indication:  Trouble Sleeping       Follow-up Information    Schedule an  appointment as soon as possible for a visit with [facility].   Why:  For monitoring of medical conditions and for lab-work to determine diabetes risk    Contact information:   [address] [place] [address] [phone]      Follow up with [facility]. Go on 02/14/2016.   Why:  Medication management therapy.   Contact information:   [address] [place], [place]  [phone]      Follow-up recommendations:  Activity:  Increase activity as tolerated Diet:  low carb diet Tests:  will continue to monitor a1c. Other:  see below.  Comments:  Take all medications as prescribed. Keep all follow-up appointments as scheduled.  Do not consume alcohol or use illegal drugs while on prescription medications. Report any adverse effects from your medications to your primary care provider promptly.  In the event of recurrent symptoms or worsening symptoms, call 911, a crisis hotline, or go to the nearest emergency department for evaluation.   SignedFransisca Kaufmann, NP-C 01/25/2016, 11:47 AM

## 2016-01-25 NOTE — BH Assessment (Addendum)
[facility] Assessment Progress Note Patient was seen this date by Earlene Plater NP and Crist Fat LCAS to discuss disposition and treatment plan for discharge. Patient was going to be discharged on 01/24/16 in the afternoon, although patient's family could not be reached to provide transportation home to his residence. Patient denies any S/I or H/I this date and stated he felt the medication adjustment that was made yesterday by Earlene Plater NP was controlling his symptoms reporting that he is feeling, "much better."  Patient's family was contacted by this Clinical research associate with family member (father Dawaun Brancato) stating patient can return home and will make sure patient follows up with after care.Patient was discharged this date and follow up with [facility] on February 14, 2016 for therapy and medication management.

## 2016-01-25 NOTE — Progress Notes (Signed)
Nursing Discharge Note:  Patient continues to deny SI/HI/AVH, has signed his copy of the AVS and the other copy placed in chart contents; pt has signed for his Belongings after their return. Pateint given prescription and understands his medication regimine and also his followup instructions. Patient escorted by staff to the lobby where his Father is awaiting him to take him home.

## 2016-01-25 NOTE — Progress Notes (Signed)
D: Patient resting in bed with eyes closed.  Respirations even and unlabored.  Patient appears to be in no apparent distress. A: Staff to monitor Q 15 mins for safety.   R:Patient remains safe on the unit.  

## 2016-08-22 ENCOUNTER — Emergency Department (HOSPITAL_BASED_OUTPATIENT_CLINIC_OR_DEPARTMENT_OTHER)
Admission: EM | Admit: 2016-08-22 | Discharge: 2016-08-22 | Disposition: A | Discharge status: Home / Self Care | Payer: Medicaid Other | Attending: Emergency Medicine | Admitting: Emergency Medicine

## 2016-08-22 ENCOUNTER — Encounter (HOSPITAL_BASED_OUTPATIENT_CLINIC_OR_DEPARTMENT_OTHER): Payer: Self-pay | Admitting: Emergency Medicine

## 2016-08-22 DIAGNOSIS — W1840XA Slipping, tripping and stumbling without falling, unspecified, initial encounter: Secondary | ICD-10-CM | POA: Insufficient documentation

## 2016-08-22 DIAGNOSIS — Y939 Activity, unspecified: Secondary | ICD-10-CM | POA: Diagnosis not present

## 2016-08-22 DIAGNOSIS — Y929 Unspecified place or not applicable: Secondary | ICD-10-CM | POA: Insufficient documentation

## 2016-08-22 DIAGNOSIS — S79912A Unspecified injury of left hip, initial encounter: Secondary | ICD-10-CM | POA: Diagnosis present

## 2016-08-22 DIAGNOSIS — S7002XA Contusion of left hip, initial encounter: Secondary | ICD-10-CM | POA: Diagnosis not present

## 2016-08-22 DIAGNOSIS — M25552 Pain in left hip: Secondary | ICD-10-CM

## 2016-08-22 DIAGNOSIS — T148XXA Other injury of unspecified body region, initial encounter: Secondary | ICD-10-CM

## 2016-08-22 DIAGNOSIS — Y999 Unspecified external cause status: Secondary | ICD-10-CM | POA: Insufficient documentation

## 2016-08-22 HISTORY — DX: Schizoaffective disorder, unspecified: F25.9

## 2016-08-22 MED ORDER — METHOCARBAMOL 500 MG PO TABS
1000.0000 mg | ORAL_TABLET | Freq: Once | ORAL | Status: AC
  Administered 2016-08-22: 1000 mg via ORAL
  Filled 2016-08-22: qty 2

## 2016-08-22 MED ORDER — METHOCARBAMOL 500 MG PO TABS: 500.0000 mg | ORAL_TABLET | Freq: Two times a day (BID) | ORAL | 0 refills | Status: DC

## 2016-08-22 MED ORDER — DICLOFENAC SODIUM 1 % TD GEL: 4.0000 g | Freq: Four times a day (QID) | TRANSDERMAL | 0 refills | Status: DC

## 2016-08-22 MED ORDER — LIDOCAINE 5 % EX PTCH: 1.0000 | MEDICATED_PATCH | CUTANEOUS | 0 refills | Status: DC

## 2016-08-22 NOTE — ED Provider Notes (Signed)
[facility]EM[facility]Y [facility] Provider Note   CSN: [id number] Arrival date & time: 08/22/16  5621     History   Chief Complaint Chief Complaint  Patient presents with  . Hip Pain    HPI [name] is a 30 y.o. male.  The history is provided by the patient.  Hip Pain  This is a new problem. The current episode started 2 days ago. The problem occurs constantly. The problem has not changed since onset.Pertinent negatives include no chest pain and no abdominal pain. The symptoms are aggravated by bending. Nothing relieves the symptoms. He has tried acetaminophen for the symptoms. The treatment provided no relief.  Seen by his PMD for same after tripping.  He did not fall all the way to ground.  Had Xrays (see care everywhere) at [facility] which were normal but patient says tylenol is not helping.    Past Medical History:  Diagnosis Date  . Bipolar 1 disorder (HCC)   . High cholesterol   . Schizoaffective disorder [facility])     Patient Active Problem List   Diagnosis Date Noted  . Bipolar 1 disorder (HCC) 01/24/2016  . Suicidal ideations   . Schizoaffective disorder, depressive type (HCC) 10/18/2015  . Schizoaffective disorder, bipolar type (HCC) 10/18/2015  . Suicidal ideation     Past Surgical History:  Procedure Laterality Date  . GANGLION CYST EXCISION    . PILONIDAL CYST EXCISION         Home Medications    Prior to Admission medications   Medication Sig Start Date End Date Taking? Authorizing Provider  albuterol (PROVENTIL HFA;VENTOLIN HFA) 108 (90 BASE) MCG/ACT inhaler Inhale 2 puffs into the lungs every 4 (four) hours as needed for wheezing or shortness of breath. 10/23/15   Truman Hayward, FNP  ARIPiprazole (ABILIFY) 10 MG tablet Take 1 tablet (10 mg total) by mouth daily. 01/25/16   Thermon Leyland, NP  traZODone (DESYREL) 50 MG tablet Take 1 tablet (50 mg total) by mouth at bedtime as needed for sleep (may repeat x1). 01/25/16   Thermon Leyland, NP    Family  History No family history on file.  Social History Social History  Substance Use Topics  . Smoking status: Never Smoker  . Smokeless tobacco: Never Used  . Alcohol use No     Allergies   Aspirin; Ibuprofen; Metformin and related; Other; Ritalin [methylphenidate hcl]; and Shellfish-derived products   Review of Systems Review of Systems  Cardiovascular: Negative for chest pain.  Gastrointestinal: Negative for abdominal pain.  Musculoskeletal: Positive for arthralgias. Negative for back pain, gait problem and joint swelling.  Neurological: Negative for weakness and numbness.  All other systems reviewed and are negative.    Physical Exam Updated Vital Signs BP 160/74 (BP Location: Right Arm)   Pulse 81   Temp 97.7 F (36.5 C) (Oral)   Resp 18   SpO2 100%   Physical Exam  Constitutional: He is oriented to person, place, and time. He appears well-developed and well-nourished. He appears distressed.  HENT:  Head: Normocephalic and atraumatic.  Mouth/Throat: No oropharyngeal exudate.  Eyes: EOM are normal. Pupils are equal, round, and reactive to light.  Neck: Normal range of motion. Neck supple.  Cardiovascular: Normal rate, regular rhythm, normal heart sounds and intact distal pulses.   Pulmonary/Chest: Effort normal and breath sounds normal.  Abdominal: Soft. Bowel sounds are normal. There is no tenderness.  Musculoskeletal: Normal range of motion. He exhibits no deformity.  Left hip: He exhibits normal range of motion, normal strength, no tenderness, no bony tenderness, no swelling, no crepitus, no deformity and no laceration.       Left knee: Normal.       Left ankle: Normal. Achilles tendon normal.       Left upper leg: Normal.       Left lower leg: Normal.       Legs:      Right foot: Normal.  Neurological: He is alert and oriented to person, place, and time. He has normal reflexes.  Skin: Skin is warm and dry. Capillary refill takes less than 2 seconds.      ED Treatments / Results   Vitals:   08/22/16 0321  BP: 160/74  Pulse: 81  Resp: 18  Temp: 97.7 F (36.5 C)    [facility] care everywhere and these are normal.  There is no indication for repeat imaging.    Given patient's history and frequent pain complaints narcotics are neither indicated nor prudent.  Will give RX for robaxin, lidoderm, and voltaren gel (allergy to NSAids is blood in urine.  As gel is only minimally systemically absorbed I believe this will be good for his pain)  Radiology No results found.  Procedures Procedures (including critical care time)  Medications Ordered in ED Medications  methocarbamol (ROBAXIN) tablet 1,000 mg (1,000 mg Oral Given 08/22/16 0346)     Initial Impression / Assessment and Plan / ED Course  I have reviewed the triage vital signs and the nursing notes.  Pertinent labs & imaging results that were available during my care of the patient were reviewed by me and considered in my medical decision making (see chart for details).  Clinical Course   All questions answered to patient's satisfaction. Based on history and exam patient has been appropriately medically screened and emergency conditions excluded. Patient is stable for discharge at this time. Follow up with your PMD for recheck in 2 days and strict return precautions given  Final Clinical Impressions(s) / ED Diagnoses   Final diagnoses:  None    New Prescriptions New Prescriptions   No medications on file     Clive Parcel, MD 08/22/16 [phone]

## 2016-08-22 NOTE — ED Triage Notes (Signed)
Pt c/o left hip pain. Pt states he was seen at PMD office yesterday and had negative xray. Pt states tylenol isn't helping.

## 2017-04-07 DIAGNOSIS — E7800 Pure hypercholesterolemia, unspecified: Secondary | ICD-10-CM | POA: Insufficient documentation

## 2017-04-07 DIAGNOSIS — E118 Type 2 diabetes mellitus with unspecified complications: Secondary | ICD-10-CM | POA: Insufficient documentation

## 2017-05-27 IMAGING — DX DG SACRUM/COCCYX 2+V
2 series · 2 of 2 positions shown · non-contrast
Comparison: None.

CLINICAL DATA: Slid down steps, with bilateral buttock pain.
Initial encounter.

EXAM:
SACRUM AND COCCYX - 2+ VIEW

[sacrum ap]
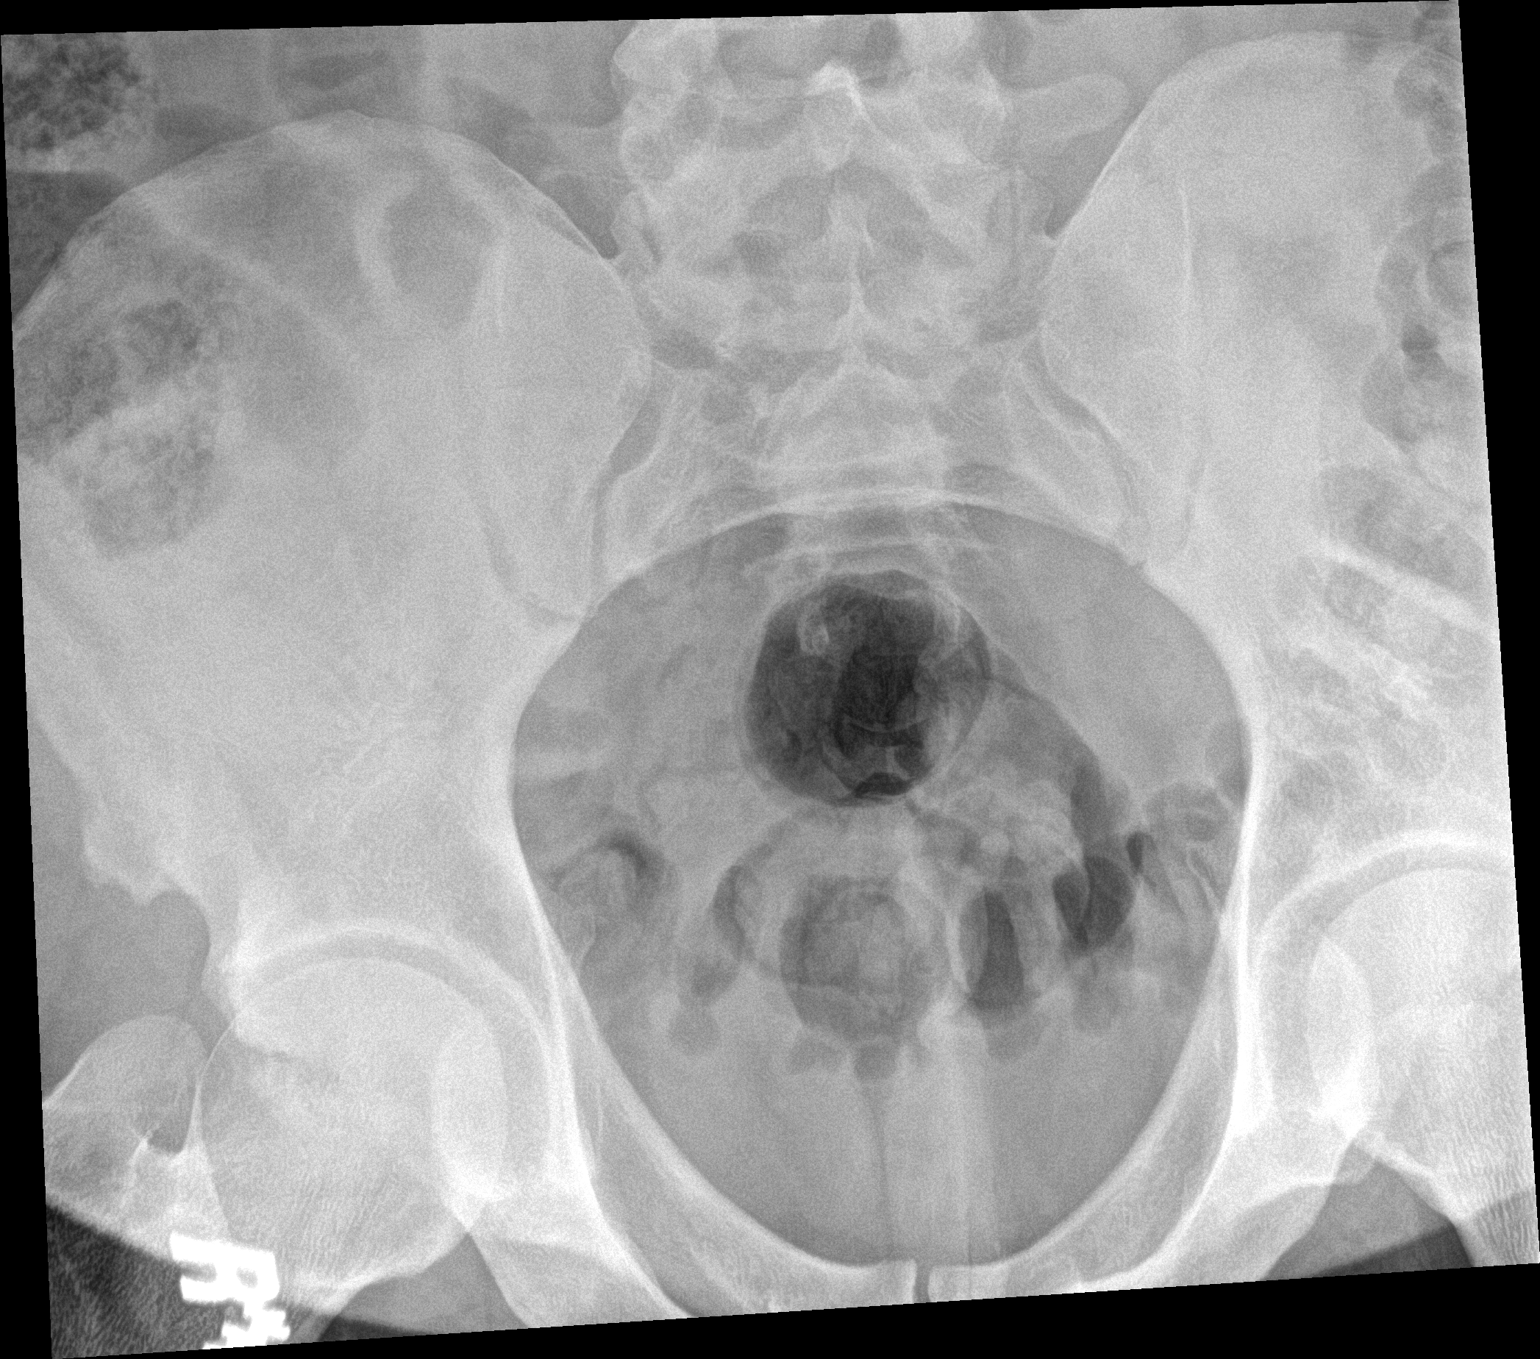

[sacrum lat]
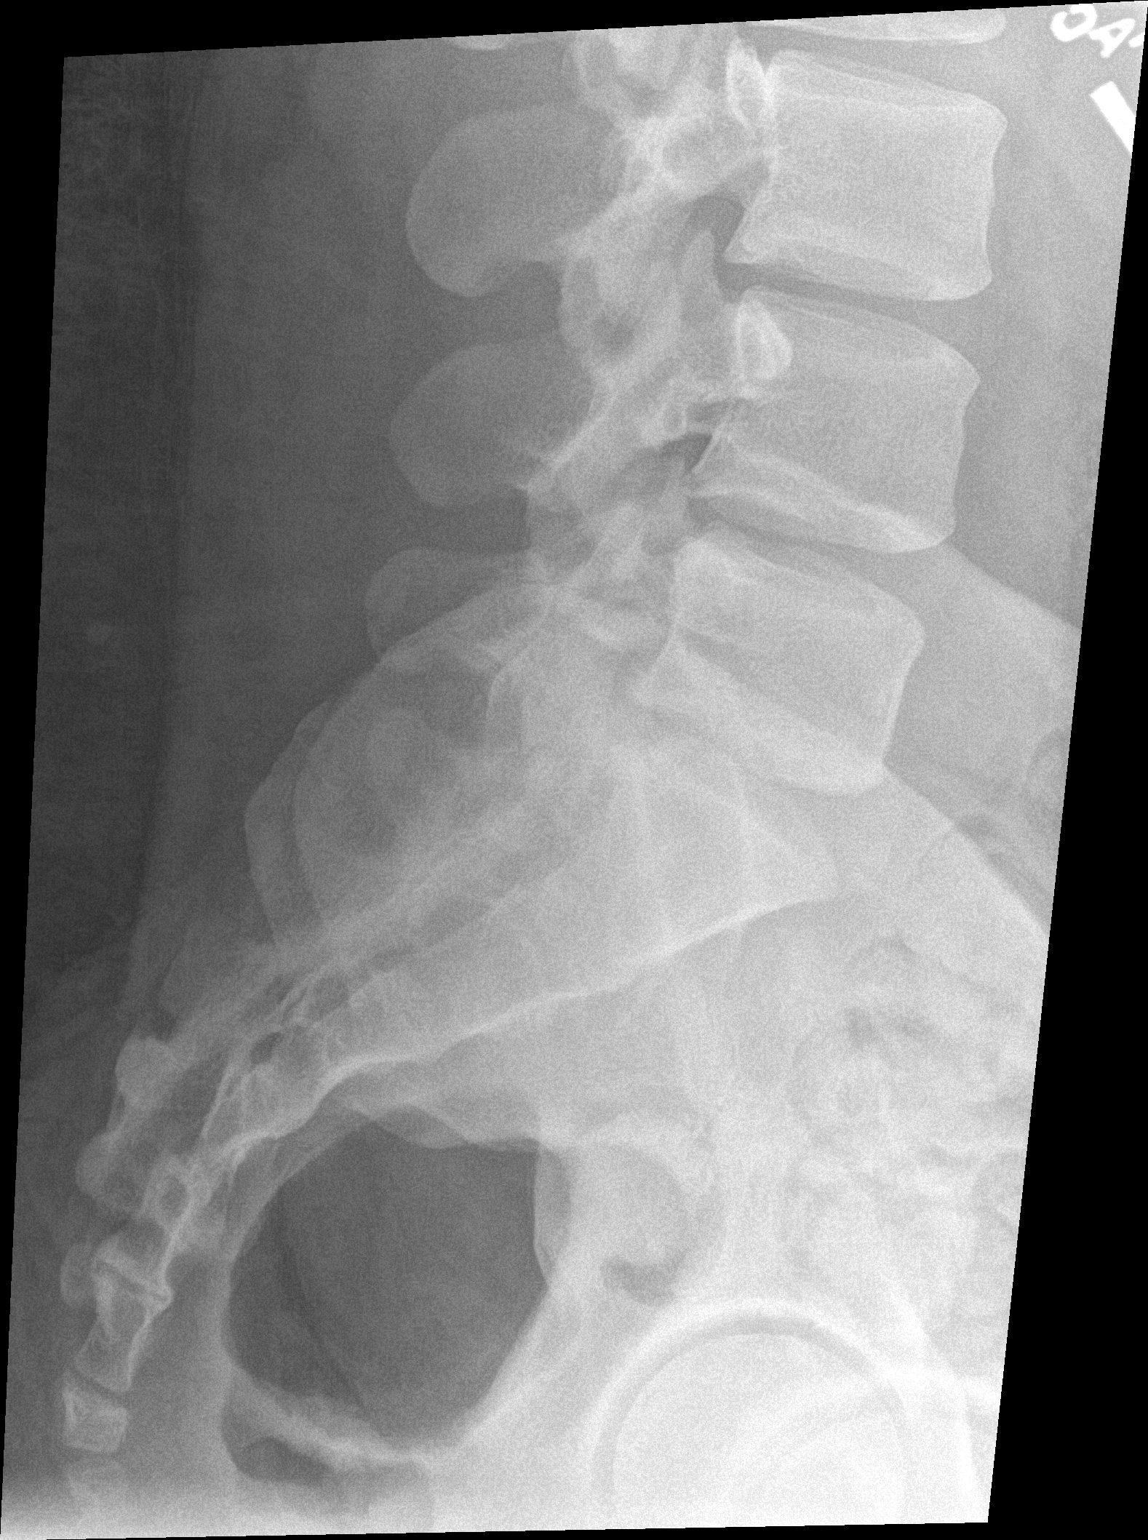

[2 of 2 positions shown; findings below may reference images not displayed]

FINDINGS: There is no evidence of fracture or dislocation. The sacrum and
coccyx are unremarkable in appearance. The sacroiliac joints are
within normal limits. The hip joints are grossly unremarkable. The
visualized bowel gas pattern is grossly unremarkable.
IMPRESSION: No evidence of fracture or dislocation.

## 2018-05-28 ENCOUNTER — Encounter (HOSPITAL_BASED_OUTPATIENT_CLINIC_OR_DEPARTMENT_OTHER): Payer: Self-pay | Admitting: Emergency Medicine

## 2018-05-28 ENCOUNTER — Emergency Department (HOSPITAL_BASED_OUTPATIENT_CLINIC_OR_DEPARTMENT_OTHER): Payer: Medicaid Other

## 2018-05-28 ENCOUNTER — Emergency Department (HOSPITAL_BASED_OUTPATIENT_CLINIC_OR_DEPARTMENT_OTHER)
Admission: EM | Admit: 2018-05-28 | Discharge: 2018-05-28 | Disposition: A | Discharge status: Home / Self Care | Payer: Medicaid Other | Attending: Emergency Medicine | Admitting: Emergency Medicine

## 2018-05-28 ENCOUNTER — Other Ambulatory Visit: Payer: Self-pay

## 2018-05-28 DIAGNOSIS — Y929 Unspecified place or not applicable: Secondary | ICD-10-CM | POA: Insufficient documentation

## 2018-05-28 DIAGNOSIS — S63614A Unspecified sprain of right ring finger, initial encounter: Secondary | ICD-10-CM | POA: Diagnosis not present

## 2018-05-28 DIAGNOSIS — Y999 Unspecified external cause status: Secondary | ICD-10-CM | POA: Diagnosis not present

## 2018-05-28 DIAGNOSIS — W2105XA Struck by basketball, initial encounter: Secondary | ICD-10-CM | POA: Diagnosis not present

## 2018-05-28 DIAGNOSIS — S60944A Unspecified superficial injury of right ring finger, initial encounter: Secondary | ICD-10-CM | POA: Diagnosis present

## 2018-05-28 DIAGNOSIS — Y9367 Activity, basketball: Secondary | ICD-10-CM | POA: Diagnosis not present

## 2018-05-28 DIAGNOSIS — Z79899 Other long term (current) drug therapy: Secondary | ICD-10-CM | POA: Insufficient documentation

## 2018-05-28 NOTE — ED Triage Notes (Signed)
Pain and swelling to R ring finger. Pt injured it playing basketball yesterday.

## 2018-05-28 NOTE — ED Provider Notes (Signed)
[facility] EMERGENCY DEPARTMENT Provider Note   CSN: [id number] Arrival date & time: 05/28/18  1307     History   Chief Complaint Chief Complaint  Patient presents with  . Finger Injury    HPI [name] is a 32 y.o. male no history of bipolar 1, high cholesterol, schizoaffective disorder who presents for evaluation of pain and swelling to right fourth digit that began yesterday after playing basketball.  Patient reports that he was playing that hit against a possible.  Since then he has had pain and swelling to the area.  He does report he is able to move it but does report some worsening pain with range of motion.  Patient reports he has not taken any medications.  He did apply ice to the affected area.  Patient denies any numbness/weakness.  The history is provided by the patient.    Past Medical History:  Diagnosis Date  . Bipolar 1 disorder (HCC)   . High cholesterol   . Schizoaffective disorder [facility])     Patient Active Problem List   Diagnosis Date Noted  . Bipolar 1 disorder (HCC) 01/24/2016  . Suicidal ideations   . Schizoaffective disorder, depressive type (HCC) 10/18/2015  . Schizoaffective disorder, bipolar type (HCC) 10/18/2015  . Suicidal ideation     Past Surgical History:  Procedure Laterality Date  . GANGLION CYST EXCISION    . PILONIDAL CYST EXCISION          Home Medications    Prior to Admission medications   Medication Sig Start Date End Date Taking? Authorizing Provider  albuterol (PROVENTIL HFA;VENTOLIN HFA) 108 (90 BASE) MCG/ACT inhaler Inhale 2 puffs into the lungs every 4 (four) hours as needed for wheezing or shortness of breath. 10/23/15   Truman Hayward, FNP  ARIPiprazole (ABILIFY) 10 MG tablet Take 1 tablet (10 mg total) by mouth daily. 01/25/16   Thermon Leyland, NP  diclofenac sodium (VOLTAREN) 1 % GEL Apply 4 g topically 4 (four) times daily. 08/22/16   Palumbo, April, MD  lidocaine (LIDODERM) 5 % Place 1 patch onto  the skin daily. Remove & Discard patch within 12 hours or as directed by MD 08/22/16   Nicanor Alcon, April, MD  methocarbamol (ROBAXIN) 500 MG tablet Take 1 tablet (500 mg total) by mouth 2 (two) times daily. 08/22/16   Palumbo, April, MD  traZODone (DESYREL) 50 MG tablet Take 1 tablet (50 mg total) by mouth at bedtime as needed for sleep (may repeat x1). 01/25/16   Thermon Leyland, NP    Family History No family history on file.  Social History Social History   Tobacco Use  . Smoking status: Never Smoker  . Smokeless tobacco: Never Used  Substance Use Topics  . Alcohol use: No  . Drug use: No     Allergies   Aspirin; Ibuprofen; Metformin and related; Other; Ritalin [methylphenidate hcl]; and Shellfish-derived products   Review of Systems Review of Systems  Musculoskeletal:       Right 4th finger pain  Neurological: Negative for weakness and numbness.     Physical Exam Updated Vital Signs BP (!) 142/85 (BP Location: Left Arm)   Pulse 66   Temp 98.2 F (36.8 C) (Oral)   Resp 20   Ht 6\' 2"  (1.88 m)   Wt (!) 150.6 kg (332 lb)   SpO2 96%   BMI 42.63 kg/m   Physical Exam  Constitutional: He appears well-developed and well-nourished.  HENT:  Head: Normocephalic and  atraumatic.  Eyes: Conjunctivae and EOM are normal. Right eye exhibits no discharge. Left eye exhibits no discharge. No scleral icterus.  Cardiovascular:  Pulses:      Radial pulses are 2+ on the right side, and 2+ on the left side.  Pulmonary/Chest: Effort normal.  Musculoskeletal:  Nurse palpation noted to the right fourth digit with some overlying soft tissue swelling, particular at the PIP.  No deformity or crepitus noted.  Full flexion/extension of 40 to intact without any difficulty.  DIP flexion/extension is intact when held in isolation.  Patient has no difficulty making a fist or extending all of his fingers.  No snuffbox tenderness.  No wrist tenderness palpation.  No abnormalities of the left hand.    Neurological: He is alert.  Sensation intact along major nerve distributions of BUE  Skin: Skin is warm and dry. Capillary refill takes less than 2 seconds.  Good distal cap refill. BUE is not dusky in appearance or cool to touch.  Psychiatric: He has a normal mood and affect. His speech is normal and behavior is normal.  Nursing note and vitals reviewed.    ED Treatments / Results  Labs (all labs ordered are listed, but only abnormal results are displayed) Labs Reviewed - No data to display  EKG None  Radiology Dg Finger Ring Right  Result Date: 05/28/2018 CLINICAL DATA:  32 year old male with right fourth digit proximal interphalangeal joint redness and swelling. Injury sustained playing basketball yesterday. EXAM: RIGHT RING FINGER 2+V COMPARISON:  Prior radiographs of the right hand 03/08/2018 FINDINGS: Soft tissue swelling surrounds the proximal interphalangeal joint of the ring finger. No acute fracture identified. The remaining visualized bones and joints are intact and unremarkable. IMPRESSION: Soft tissue swelling without evidence of fracture or malalignment. Electronically Signed   By: Malachy MoanHeath  McCullough M.D.   On: 05/28/2018 14:36    Procedures Procedures (including critical care time)  Medications Ordered in ED Medications - No data to display   Initial Impression / Assessment and Plan / ED Course  I have reviewed the triage vital signs and the nursing notes.  Pertinent labs & imaging results that were available during my care of the patient were reviewed by me and considered in my medical decision making (see chart for details).     32 year old male who presents for evaluation of right fourth finger pain that began yesterday after hitting it with a basketball.  Does report pain with range of motion.  No numbness/weakness. Patient is afebrile, non-toxic appearing, sitting comfortably on examination table. Vital signs reviewed and stable. Patient is neurovascularly  intact.  On exam, tenderness palpation noted of the right fourth finger with some overlying soft tissue swelling, particularly at the PIP.  No deformity or crepitus noted.  Flexion/extension of right fourth digit intact light difficulty.  Flexion/extension of DIP intact with any difficulty.  Suspect finger sprain versus fracture versus dislocation.  Exam is not concerning for tendon injury, mallet finger, [place] finger.  X-rays ordered at triage.  X-rays viewed.  Negative for any acute fracture dislocation.  Discussed results with patient.  We will plan to put in supportive splint for support and stabilization.  Encourage rice therapy.  Instructed patient follow-up primary care doctor for further evaluation. Patient had ample opportunity for questions and discussion. All patient's questions were answered with full understanding. Strict return precautions discussed. Patient expresses understanding and agreement to plan.   Final Clinical Impressions(s) / ED Diagnoses   Final diagnoses:  Sprain of right ring  finger, unspecified site of finger, initial encounter    ED Discharge Orders    None       Maxwell Caul, PA-C 05/28/18 1517    Tegeler, Canary Brim, MD 05/28/18 [phone]

## 2018-05-28 NOTE — Discharge Instructions (Signed)
You can take Tylenol or Ibuprofen as directed for pain. You can alternate Tylenol and Ibuprofen every 4 hours. If you take Tylenol at 1pm, then you can take Ibuprofen at 5pm. Then you can take Tylenol again at 9pm.   Wear the splint for support and stabilization.   Follow the RICE (Rest, Ice, Compression, Elevation) protocol as directed.   As we discussed, if you are still experiencing pain within a week, you will need to have a repeat x-ray to make sure that there was not a small fracture that was missed.  Return the emergency department for any worsening pain, redness or swelling of the finger, inability move the finger, fevers or any other worsening or concerning symptoms.

## 2018-07-23 ENCOUNTER — Other Ambulatory Visit: Payer: Self-pay

## 2018-07-23 ENCOUNTER — Emergency Department (HOSPITAL_BASED_OUTPATIENT_CLINIC_OR_DEPARTMENT_OTHER): Payer: Medicaid Other

## 2018-07-23 ENCOUNTER — Emergency Department (HOSPITAL_BASED_OUTPATIENT_CLINIC_OR_DEPARTMENT_OTHER)
Admission: EM | Admit: 2018-07-23 | Discharge: 2018-07-24 | Disposition: A | Discharge status: Home / Self Care | Payer: Medicaid Other | Attending: Emergency Medicine | Admitting: Emergency Medicine

## 2018-07-23 ENCOUNTER — Encounter (HOSPITAL_BASED_OUTPATIENT_CLINIC_OR_DEPARTMENT_OTHER): Payer: Self-pay

## 2018-07-23 DIAGNOSIS — Y929 Unspecified place or not applicable: Secondary | ICD-10-CM | POA: Diagnosis not present

## 2018-07-23 DIAGNOSIS — Y939 Activity, unspecified: Secondary | ICD-10-CM | POA: Insufficient documentation

## 2018-07-23 DIAGNOSIS — M1990 Unspecified osteoarthritis, unspecified site: Secondary | ICD-10-CM

## 2018-07-23 DIAGNOSIS — W57XXXA Bitten or stung by nonvenomous insect and other nonvenomous arthropods, initial encounter: Secondary | ICD-10-CM | POA: Insufficient documentation

## 2018-07-23 DIAGNOSIS — S90862A Insect bite (nonvenomous), left foot, initial encounter: Secondary | ICD-10-CM | POA: Diagnosis not present

## 2018-07-23 DIAGNOSIS — Z79899 Other long term (current) drug therapy: Secondary | ICD-10-CM | POA: Diagnosis not present

## 2018-07-23 DIAGNOSIS — M79672 Pain in left foot: Secondary | ICD-10-CM

## 2018-07-23 DIAGNOSIS — Y999 Unspecified external cause status: Secondary | ICD-10-CM | POA: Diagnosis not present

## 2018-07-23 DIAGNOSIS — S99922A Unspecified injury of left foot, initial encounter: Secondary | ICD-10-CM | POA: Diagnosis present

## 2018-07-23 MED ORDER — PREDNISONE 20 MG PO TABS: 40.0000 mg | ORAL_TABLET | Freq: Once | ORAL | Status: DC

## 2018-07-23 MED ORDER — OXYCODONE-ACETAMINOPHEN 5-325 MG PO TABS
2.0000 | ORAL_TABLET | Freq: Once | ORAL | Status: AC
  Administered 2018-07-23: 2 via ORAL
  Filled 2018-07-23: qty 2

## 2018-07-23 MED ORDER — HYDROCODONE-ACETAMINOPHEN 5-325 MG PO TABS: 1.0000 | ORAL_TABLET | ORAL | 0 refills | Status: DC | PRN

## 2018-07-23 MED ORDER — DOXYCYCLINE HYCLATE 100 MG PO CAPS: 100.0000 mg | ORAL_CAPSULE | Freq: Two times a day (BID) | ORAL | 0 refills | Status: AC

## 2018-07-23 MED ORDER — DEXAMETHASONE 4 MG PO TABS
10.0000 mg | ORAL_TABLET | Freq: Once | ORAL | Status: AC
Start: 2018-07-23 — End: 2018-07-24
  Administered 2018-07-24: 10 mg via ORAL
  Filled 2018-07-23: qty 1

## 2018-07-23 NOTE — Discharge Instructions (Addendum)
Take the pain medication as needed.  Take the Doxycycline 100 mg every 12 hours for 7 days.  Wear the hard shoe for 3-5 days, for comfort and as needed.

## 2018-07-23 NOTE — ED Triage Notes (Signed)
Pt c/o left greater toe pain.
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
## 2018-07-23 NOTE — ED Notes (Addendum)
Alert, NAD, calm, interactive, resps e/u, speaking in clear complete sentences, no dyspnea noted, skin W&D, initial VSS, BP elevated[facility].

## 2018-07-23 NOTE — ED Notes (Signed)
Alert, NAD, calm, interactive, resps e/u, speaking in clear complete sentences, no dyspnea noted, skin W&D, VSS, c/o L foot pain and swelling, no obvious redness, heat, wound, or bruising, (denies: h/o DM, injury, sob, nausea, numbness, tingling, dizziness or visual changes), EDP into room. [facility]

## 2018-07-24 NOTE — ED Notes (Signed)
Pt verbalizes understanding to pick up Rx as directed on paperwork. D/c home with family member to drive

## 2018-07-24 NOTE — ED Notes (Signed)
PMS intact before and after. Pt tolerated well. All questions answered. 

## 2020-05-31 ENCOUNTER — Emergency Department (HOSPITAL_COMMUNITY): Payer: Medicaid Other

## 2020-05-31 ENCOUNTER — Other Ambulatory Visit: Payer: Self-pay

## 2020-05-31 ENCOUNTER — Encounter (HOSPITAL_COMMUNITY): Payer: Self-pay

## 2020-05-31 ENCOUNTER — Emergency Department (HOSPITAL_COMMUNITY)
Admission: EM | Admit: 2020-05-31 | Discharge: 2020-05-31 | Disposition: A | Discharge status: Home / Self Care | Payer: Medicaid Other | Attending: Emergency Medicine | Admitting: Emergency Medicine

## 2020-05-31 DIAGNOSIS — Z79899 Other long term (current) drug therapy: Secondary | ICD-10-CM | POA: Insufficient documentation

## 2020-05-31 DIAGNOSIS — S060X0A Concussion without loss of consciousness, initial encounter: Secondary | ICD-10-CM

## 2020-05-31 DIAGNOSIS — Y93I9 Activity, other involving external motion: Secondary | ICD-10-CM | POA: Insufficient documentation

## 2020-05-31 DIAGNOSIS — Y92410 Unspecified street and highway as the place of occurrence of the external cause: Secondary | ICD-10-CM | POA: Diagnosis not present

## 2020-05-31 DIAGNOSIS — Y999 Unspecified external cause status: Secondary | ICD-10-CM | POA: Diagnosis not present

## 2020-05-31 DIAGNOSIS — M25512 Pain in left shoulder: Secondary | ICD-10-CM | POA: Diagnosis not present

## 2020-05-31 DIAGNOSIS — S0990XA Unspecified injury of head, initial encounter: Secondary | ICD-10-CM | POA: Diagnosis present

## 2020-05-31 NOTE — ED Notes (Signed)
Discharge instructions discussed with pt. Pt verbalized understanding with no questions at this time.  
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
## 2020-05-31 NOTE — ED Notes (Signed)
EDP at bedside  
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
## 2020-06-02 ENCOUNTER — Emergency Department (HOSPITAL_BASED_OUTPATIENT_CLINIC_OR_DEPARTMENT_OTHER)
Admission: EM | Admit: 2020-06-02 | Discharge: 2020-06-02 | Disposition: A | Discharge status: Home / Self Care | Payer: Medicaid Other | Attending: Emergency Medicine | Admitting: Emergency Medicine

## 2020-06-02 ENCOUNTER — Encounter (HOSPITAL_BASED_OUTPATIENT_CLINIC_OR_DEPARTMENT_OTHER): Payer: Self-pay | Admitting: Emergency Medicine

## 2020-06-02 ENCOUNTER — Other Ambulatory Visit: Payer: Self-pay

## 2020-06-02 DIAGNOSIS — S161XXD Strain of muscle, fascia and tendon at neck level, subsequent encounter: Secondary | ICD-10-CM | POA: Diagnosis not present

## 2020-06-02 DIAGNOSIS — S199XXD Unspecified injury of neck, subsequent encounter: Secondary | ICD-10-CM | POA: Diagnosis present

## 2020-06-02 DIAGNOSIS — S161XXA Strain of muscle, fascia and tendon at neck level, initial encounter: Secondary | ICD-10-CM

## 2020-06-02 MED ORDER — ACETAMINOPHEN 500 MG PO TABS: 500.0000 mg | ORAL_TABLET | Freq: Four times a day (QID) | ORAL | 0 refills | Status: DC | PRN

## 2020-06-02 MED ORDER — METHOCARBAMOL 500 MG PO TABS: 500.0000 mg | ORAL_TABLET | Freq: Two times a day (BID) | ORAL | 0 refills | Status: DC

## 2020-06-02 NOTE — Discharge Instructions (Signed)
Please take Tylenol and the Robaxin for your symptoms of discomfort.  You were given a prescription for Robaxin which is a muscle relaxer.  You should not drive, work, consume alcohol, or operate machinery while taking this medication as it can make you very drowsy.    Please also read the attachment on concussion.  I would like you to avoid screens and give your brain plenty of rest.  You neuro exam is reassuring.  Please follow-up with your primary care provider in the next few days regarding this encounter and for ongoing evaluation/management of your diagnosed concussion.  Return to the ED or seek immediate medical attention if you develop any new or worsening symptoms.
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
## 2020-08-12 DIAGNOSIS — I10 Essential (primary) hypertension: Secondary | ICD-10-CM | POA: Insufficient documentation

## 2024-03-14 ENCOUNTER — Ambulatory Visit (INDEPENDENT_AMBULATORY_CARE_PROVIDER_SITE_OTHER): Payer: MEDICAID | Admitting: Licensed Clinical Social Worker

## 2024-03-14 DIAGNOSIS — F250 Schizoaffective disorder, bipolar type: Secondary | ICD-10-CM
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
## 2024-03-24 ENCOUNTER — Ambulatory Visit (HOSPITAL_COMMUNITY): Payer: MEDICAID | Admitting: Student

## 2024-03-24 ENCOUNTER — Encounter (HOSPITAL_COMMUNITY): Payer: Self-pay | Admitting: Student

## 2024-03-24 VITALS — BP 128/80 | HR 82 | Ht 74.0 in | Wt 338.2 lb

## 2024-03-24 DIAGNOSIS — R5383 Other fatigue: Secondary | ICD-10-CM

## 2024-03-24 DIAGNOSIS — R29818 Other symptoms and signs involving the nervous system: Secondary | ICD-10-CM

## 2024-03-24 DIAGNOSIS — F6381 Intermittent explosive disorder: Secondary | ICD-10-CM

## 2024-03-24 DIAGNOSIS — Z8659 Personal history of other mental and behavioral disorders: Secondary | ICD-10-CM | POA: Diagnosis not present

## 2024-03-24 DIAGNOSIS — Z0000 Encounter for general adult medical examination without abnormal findings: Secondary | ICD-10-CM

## 2024-03-24 MED ORDER — HYDROXYZINE HCL 25 MG PO TABS: 25.0000 mg | ORAL_TABLET | Freq: Three times a day (TID) | ORAL | 1 refills | Status: AC | PRN
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
## 2024-03-28 ENCOUNTER — Encounter (HOSPITAL_COMMUNITY): Payer: Self-pay | Admitting: Student

## 2024-03-28 DIAGNOSIS — R5383 Other fatigue: Secondary | ICD-10-CM | POA: Insufficient documentation

## 2024-03-28 DIAGNOSIS — R29818 Other symptoms and signs involving the nervous system: Secondary | ICD-10-CM | POA: Insufficient documentation

## 2024-03-28 DIAGNOSIS — F6381 Intermittent explosive disorder: Secondary | ICD-10-CM | POA: Insufficient documentation

## 2024-03-29 ENCOUNTER — Other Ambulatory Visit (INDEPENDENT_AMBULATORY_CARE_PROVIDER_SITE_OTHER): Payer: MEDICAID

## 2024-03-29 DIAGNOSIS — Z8659 Personal history of other mental and behavioral disorders: Secondary | ICD-10-CM | POA: Diagnosis not present

## 2024-03-29 DIAGNOSIS — Z0000 Encounter for general adult medical examination without abnormal findings: Secondary | ICD-10-CM

## 2024-03-29 DIAGNOSIS — R5383 Other fatigue: Secondary | ICD-10-CM | POA: Diagnosis not present

## 2024-03-29 DIAGNOSIS — Z79899 Other long term (current) drug therapy: Secondary | ICD-10-CM

## 2024-03-29 NOTE — Progress Notes (Signed)
 Pt tolerated labs well in left hand with no complaints.    JNL, CMA

## 2024-03-30 LAB — CBC WITH DIFFERENTIAL/PLATELET
Basophils Absolute: 0.1 10*3/uL (ref 0.0–0.2)
Basos: 1 %
EOS (ABSOLUTE): 0.2 10*3/uL (ref 0.0–0.4)
Eos: 2 %
Hematocrit: 42.6 % (ref 37.5–51.0)
Hemoglobin: 13.3 g/dL (ref 13.0–17.7)
Immature Grans (Abs): 0 10*3/uL (ref 0.0–0.1)
Immature Granulocytes: 0 %
Lymphocytes Absolute: 3 10*3/uL (ref 0.7–3.1)
Lymphs: 33 %
MCH: 26.4 pg — ABNORMAL LOW (ref 26.6–33.0)
MCHC: 31.2 g/dL — ABNORMAL LOW (ref 31.5–35.7)
MCV: 85 fL (ref 79–97)
Monocytes Absolute: 0.6 10*3/uL (ref 0.1–0.9)
Monocytes: 7 %
Neutrophils Absolute: 5.3 10*3/uL (ref 1.4–7.0)
Neutrophils: 57 %
Platelets: 383 10*3/uL (ref 150–450)
RBC: 5.03 x10E6/uL (ref 4.14–5.80)
RDW: 12.7 % (ref 11.6–15.4)
WBC: 9.2 10*3/uL (ref 3.4–10.8)

## 2024-03-30 LAB — COMPREHENSIVE METABOLIC PANEL WITH GFR
ALT: 26 IU/L (ref 0–44)
AST: 17 IU/L (ref 0–40)
Albumin: 4.5 g/dL (ref 4.1–5.1)
Alkaline Phosphatase: 85 IU/L (ref 44–121)
BUN/Creatinine Ratio: 11 (ref 9–20)
BUN: 13 mg/dL (ref 6–20)
Bilirubin Total: 0.6 mg/dL (ref 0.0–1.2)
CO2: 23 mmol/L (ref 20–29)
Calcium: 9.8 mg/dL (ref 8.7–10.2)
Chloride: 99 mmol/L (ref 96–106)
Creatinine, Ser: 1.2 mg/dL (ref 0.76–1.27)
Globulin, Total: 2.6 g/dL (ref 1.5–4.5)
Glucose: 259 mg/dL — ABNORMAL HIGH (ref 70–99)
Potassium: 4.2 mmol/L (ref 3.5–5.2)
Sodium: 136 mmol/L (ref 134–144)
Total Protein: 7.1 g/dL (ref 6.0–8.5)
eGFR: 80 mL/min/{1.73_m2} (ref >59)

## 2024-03-30 LAB — LIPID PANEL
Chol/HDL Ratio: 5.8 ratio — ABNORMAL HIGH (ref 0.0–5.0)
Cholesterol, Total: 196 mg/dL (ref 100–199)
HDL: 34 mg/dL — ABNORMAL LOW (ref >39)
LDL Chol Calc (NIH): 131 mg/dL — ABNORMAL HIGH (ref 0–99)
Triglycerides: 174 mg/dL — ABNORMAL HIGH (ref 0–149)
VLDL Cholesterol Cal: 31 mg/dL (ref 5–40)

## 2024-03-30 LAB — VITAMIN D 25 HYDROXY (VIT D DEFICIENCY, FRACTURES): Vit D, 25-Hydroxy: 21 ng/mL — ABNORMAL LOW (ref 30.0–100.0)

## 2024-03-30 LAB — HEMOGLOBIN A1C
Est. average glucose Bld gHb Est-mCnc: 226 mg/dL
Hgb A1c MFr Bld: 9.5 % — ABNORMAL HIGH (ref 4.8–5.6)

## 2024-03-30 LAB — TSH: TSH: 1.06 u[IU]/mL (ref 0.450–4.500)

## 2024-04-18 ENCOUNTER — Ambulatory Visit (INDEPENDENT_AMBULATORY_CARE_PROVIDER_SITE_OTHER): Payer: MEDICAID | Admitting: Licensed Clinical Social Worker

## 2024-04-18 DIAGNOSIS — F6381 Intermittent explosive disorder: Secondary | ICD-10-CM

## 2024-04-18 DIAGNOSIS — F250 Schizoaffective disorder, bipolar type: Secondary | ICD-10-CM | POA: Diagnosis not present

## 2024-04-18 NOTE — Progress Notes (Signed)
 THERAPIST PROGRESS NOTE  Virtual Visit via Video Note  I connected with [name] on 04/18/24 at  9:00 AM EDT by a video enabled telemedicine application and verified that I am speaking with the correct person using two identifiers.  Location: Patient: [facility]  Provider: Providers Home    I discussed the limitations of evaluation and management by telemedicine and the availability of in person appointments. The patient expressed understanding and agreed to proceed.     I discussed the assessment and treatment plan with the patient. The patient was provided an opportunity to ask questions and all were answered. The patient agreed with the plan and demonstrated an understanding of the instructions.   The patient was advised to call back or seek an in-person evaluation if the symptoms worsen or if the condition fails to improve as anticipated.  I provided 30 minutes of non-face-to-face time during this encounter.   Maryagnes Small, LCSW   Participation Level: Active  Behavioral Response: CasualAlertAnxious  Type of Therapy: Individual Therapy  Treatment Goals addressed:  Active     Social Interpersonal Effectiveness     LTG: [name] will attend and participate in therapeutic, recreational and educational activities that support interpersonal effectiveness   (Progressing)     Start:  03/14/24    Expected End:  07/28/24         LTG: [name] will recognize socially inappropriate behaviors and develop alternative behaviors (Progressing)     Start:  03/14/24    Expected End:  07/28/24         STG: [name] will reduce frequency of avoidant behaviors by 50% as evidenced by self-report in therapy sessions (Progressing)     Start:  03/14/24    Expected End:  07/28/24         Observe patient's social engagement     Start:  03/14/24         Encourage and recognize when [name] displays appropriate boundaries and behaviors (Completed)     Start:  03/14/24     End:  04/18/24      Educate [name] on appropriate behaviors and boundaries (Completed)     Start:  03/14/24    End:  04/18/24      Facilitate discussions with [name] regarding miscommunications that occur during social interactions (Completed)     Start:  03/14/24    End:  04/18/24         ProgressTowards Goals: Progressing  Interventions: CBT and Motivational Interviewing  Summary: [name] is a 38 y.o. male who presents with anxious mood\affect.  Patient was pleasant, cooperative, maintained good eye contact.  [name] reports today he had to switch to virtual due to not having transportation because his ride was not feeling well.  He was dressed casually and engaged well in therapy session.  Patient reports everything has been going well.  He states that he is taking his medications and it is improved his overall mood and decreased his anger outburst.  He reports utilizing support systems such as [facility] and also family and friends.  [name] states that he is looking for employment and housing through the program at [employer] and that is progressing.  [name] states small conflicts with his sister such as who is going to get the last "porkchops".  He reports that they try to settle it over a rock paper scissors and tried to come to a compromise.  [name] states that he has problems setting healthy  boundaries especially with friends.  As evidenced by a friend that keeps talking to him about the same situation and not taking Om's advice.  [name] reports that he enjoys listening to friends and giving advice however when that advice is not taken and the situation keeps happening he reports being "annoyed".  [name] reports today he utilizes Printmaker, games, and journaling as primary coping skills.  Suicidal/Homicidal: Nowithout intent/plan  Therapist Response:      Intervention/Plan: LCSW spoke with patient about coping skills as listed above.  LCSW  spoke with patient about stressors such as family conflict, irritability, housing and financials.  LCSW spoke with patient about setting healthy boundaries within his relationships.  LCSW utilized psychoanalytic therapy for patient to express thoughts, feelings and emotions and nonjudgmental environment.  LCSW utilized supportive therapy for praise and encouragement.  LCSW utilize open-ended questions and reflective listening for motivational interviewing.   Plan: Return again in 5 weeks.  Diagnosis: Schizoaffective disorder, bipolar type (HCC)  Intermittent explosive  Collaboration of Care: Other None today   Patient/Guardian was advised Release of Information must be obtained prior to any record release in order to collaborate their care with an outside provider. Patient/Guardian was advised if they have not already done so to contact the registration department to sign all necessary forms in order for us  to release information regarding their care.   Consent: Patient/Guardian gives verbal consent for treatment and assignment of benefits for services provided during this visit. Patient/Guardian expressed understanding and agreed to proceed.   Maryagnes Small, LCSW 04/18/2024

## 2024-04-21 ENCOUNTER — Encounter (HOSPITAL_COMMUNITY): Payer: Self-pay | Admitting: Student

## 2024-04-21 ENCOUNTER — Ambulatory Visit (INDEPENDENT_AMBULATORY_CARE_PROVIDER_SITE_OTHER): Payer: MEDICAID | Admitting: Student

## 2024-04-21 DIAGNOSIS — Z8659 Personal history of other mental and behavioral disorders: Secondary | ICD-10-CM | POA: Insufficient documentation

## 2024-04-21 DIAGNOSIS — F6381 Intermittent explosive disorder: Secondary | ICD-10-CM | POA: Diagnosis not present

## 2024-04-21 DIAGNOSIS — R5383 Other fatigue: Secondary | ICD-10-CM

## 2024-04-21 DIAGNOSIS — R29818 Other symptoms and signs involving the nervous system: Secondary | ICD-10-CM | POA: Diagnosis not present

## 2024-04-21 NOTE — Patient Instructions (Signed)
 [facility]  URGENT CARE: Open 24 hours per day for acute and/or urgent behavioral health concerns.   OUTPATIENT Walk-in information:  Please note, all walk-ins are first come & first serve, with limited number of availability. Therapist for therapy:  Monday, Tuesday, Wednesday & Thursday mornings Please ARRIVE at 7:00 AM for registration Will START at 8:00 AM Every 1st, 2nd & 3rd Friday of the month: Please ARRIVE at 7:00 AM for registration Will START at 1 PM - 5 PM Psychiatrist for medication management: Monday - Friday:  Please ARRIVE at 7:00 AM for registration Will START at 8:00 AM Appointments times are as follows:  - New patients get 1 hr ex: 8-9 am 9-10 & 10-11 then that's it.  - Existing pt's that are not seeing their provider will still take 1hr as they would be new to the provider that is covering walk ins that day.  - Existing follow ups get 30 mins.... (if they have been seen by the provider covering walk ins that day.)        Regretfully, due to limited availability, please be aware that you may not been seen on the same day as walk-in. Please consider making an appoint or try again. Thank you for your patience and understanding.  [facility] [address] [place], [place] [address] [phone]  New patients are seen at their walk-in clinic. Walk-in hours are Monday - Friday from 8:30 am - 12:00 pm, and from 1:00 pm - 2:30 pm.   Walk-in patients are seen on a first come, first served basis, so try to arrive as early as possible for the best chance of being seen the same day.
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
## 2024-05-01 ENCOUNTER — Encounter: Payer: Self-pay | Admitting: Neurology

## 2024-05-01 ENCOUNTER — Institutional Professional Consult (permissible substitution): Payer: MEDICAID | Admitting: Neurology

## 2024-05-24 ENCOUNTER — Encounter (HOSPITAL_COMMUNITY): Payer: Self-pay

## 2024-05-24 ENCOUNTER — Ambulatory Visit (HOSPITAL_COMMUNITY): Payer: MEDICAID | Admitting: Licensed Clinical Social Worker

## 2024-06-22 ENCOUNTER — Ambulatory Visit (HOSPITAL_COMMUNITY): Payer: MEDICAID | Admitting: Licensed Clinical Social Worker

## 2024-07-06 ENCOUNTER — Ambulatory Visit (HOSPITAL_COMMUNITY): Payer: MEDICAID | Admitting: Licensed Clinical Social Worker

## 2024-09-07 ENCOUNTER — Ambulatory Visit (HOSPITAL_COMMUNITY): Payer: MEDICAID | Admitting: Licensed Clinical Social Worker

## 2024-09-13 ENCOUNTER — Ambulatory Visit (HOSPITAL_COMMUNITY): Payer: MEDICAID | Admitting: Licensed Clinical Social Worker

## 2024-09-13 DIAGNOSIS — F250 Schizoaffective disorder, bipolar type: Secondary | ICD-10-CM

## 2024-09-13 NOTE — Progress Notes (Signed)
 THERAPIST PROGRESS NOTE  Session Time: 30   Participation Level: Active  Behavioral Response: CasualAlertAnxious and Depressed  Type of Therapy: Individual Therapy  Treatment Goals addressed:  Resolved     Social Interpersonal Effectiveness     LTG: [name] will attend and participate in therapeutic, recreational and educational activities that support interpersonal effectiveness   (Completed/Met)     Start:  03/14/24    Expected End:  09/14/24    Resolved:  09/13/24      LTG: [name] will recognize socially inappropriate behaviors and develop alternative behaviors (Completed/Met)     Start:  03/14/24    Expected End:  09/14/24    Resolved:  09/13/24      STG: [name] will reduce frequency of avoidant behaviors by 50% as evidenced by self-report in therapy sessions (Completed/Met)     Start:  03/14/24    Expected End:  09/14/24    Resolved:  09/13/24      Observe patient's social engagement (Completed)     Start:  03/14/24    End:  09/13/24      Encourage and recognize when [name] displays appropriate boundaries and behaviors (Completed)     Start:  03/14/24    End:  04/18/24      Educate [name] on appropriate behaviors and boundaries (Completed)     Start:  03/14/24    End:  04/18/24      Facilitate discussions with [name] regarding miscommunications that occur during social interactions (Completed)     Start:  03/14/24    End:  04/18/24         ProgressTowards Goals: Progressing  Interventions: CBT, Motivational Interviewing, and Supportive  Summary: [name] is a 38 y.o. male alert and oriented x 5.  He was pleasant, cooperative, maintained good eye contact.  He engaged well in therapy session was dressed casually.  Patient presented with euthymic mood\affect.  Patient comes in today after 5 months of not being seen.  LCSW engaged with the conversation about frequency of therapy and goals for therapy.  [name] indicates today that due to [facility]  [facility] Friday 9 AM to 3 PM along with peers support groups on Tuesdays and Thursdays for 1 hour he is feeling much better with his mental health.  Patient is engaging in activities for socialization as evidenced by groups being attended.  Stating I go to them as much as possible if it is not 5 days a week it is multiple days per week.  LCSW advised patient of plan which will be to continue with services through [facility] with the option to come back to [facility] if new stressors or problems occur for mental health related diagnosis.  LCSW advised patient that as of December 15 he would no longer be at the facility but could see another provider if needed.  Patient was agreeable to plan.  Suicidal/Homicidal: Nowithout intent/plan  Therapist Response:     Intervention: LCSW utilized solution-focused therapy as stated above.  LCSW utilized psycho analytic therapy for patient to express thoughts, feelings and emotions and nonjudgmental environment.  LCSW utilized supportive therapy for praise and encouragement.  LCSW provided patient with contact information to [facility] in case he felt reassessment would be needed in the future.  LCSW utilized motivational interviewing for open-ended questions and reflective listening.     Plan: Return again in 4 weeks.  Diagnosis: Schizoaffective disorder, bipolar type (HCC)  Collaboration of Care: Other Discharge from therapy do to support at [facility]   Patient/Guardian was advised Release of Information must be obtained prior to any record release in order to collaborate their care with an outside provider. Patient/Guardian was advised if they have not already done so to contact the registration department to sign all necessary forms in order for us  to release information regarding their care.   Consent: Patient/Guardian gives verbal consent for  treatment and assignment of benefits for services provided during this visit. Patient/Guardian expressed understanding and agreed to proceed.   Juliene GORMAN Patee, LCSW 09/13/2024
